# Patient Record
Sex: Female | Born: 1940 | Race: White | Hispanic: No | Marital: Single | Smoking: Former smoker
Health system: Southern US, Community
[De-identification: ages and names within clinical notes are randomized; demographics above are authoritative.]

## PROBLEM LIST (undated history)

## (undated) DIAGNOSIS — T7840XA Allergy, unspecified, initial encounter: Secondary | ICD-10-CM

## (undated) DIAGNOSIS — B019 Varicella without complication: Secondary | ICD-10-CM

## (undated) DIAGNOSIS — H269 Unspecified cataract: Secondary | ICD-10-CM

## (undated) DIAGNOSIS — D649 Anemia, unspecified: Secondary | ICD-10-CM

## (undated) DIAGNOSIS — I10 Essential (primary) hypertension: Secondary | ICD-10-CM

## (undated) DIAGNOSIS — M810 Age-related osteoporosis without current pathological fracture: Secondary | ICD-10-CM

## (undated) DIAGNOSIS — J301 Allergic rhinitis due to pollen: Secondary | ICD-10-CM

## (undated) DIAGNOSIS — K219 Gastro-esophageal reflux disease without esophagitis: Secondary | ICD-10-CM

## (undated) DIAGNOSIS — F32A Depression, unspecified: Secondary | ICD-10-CM

## (undated) DIAGNOSIS — F329 Major depressive disorder, single episode, unspecified: Secondary | ICD-10-CM

## (undated) DIAGNOSIS — M1990 Unspecified osteoarthritis, unspecified site: Secondary | ICD-10-CM

## (undated) HISTORY — DX: Varicella without complication: B01.9

## (undated) HISTORY — PX: WISDOM TOOTH EXTRACTION: SHX21

## (undated) HISTORY — DX: Depression, unspecified: F32.A

## (undated) HISTORY — DX: Allergic rhinitis due to pollen: J30.1

## (undated) HISTORY — DX: Allergy, unspecified, initial encounter: T78.40XA

## (undated) HISTORY — DX: Gastro-esophageal reflux disease without esophagitis: K21.9

## (undated) HISTORY — DX: Unspecified osteoarthritis, unspecified site: M19.90

## (undated) HISTORY — DX: Age-related osteoporosis without current pathological fracture: M81.0

## (undated) HISTORY — DX: Unspecified cataract: H26.9

## (undated) HISTORY — DX: Major depressive disorder, single episode, unspecified: F32.9

## (undated) HISTORY — DX: Essential (primary) hypertension: I10

## (undated) HISTORY — DX: Anemia, unspecified: D64.9

---

## 1999-01-14 ENCOUNTER — Encounter: Admission: RE | Admit: 1999-01-14 | Discharge: 1999-01-14 | Payer: Self-pay | Admitting: Internal Medicine

## 1999-01-14 ENCOUNTER — Encounter: Payer: Self-pay | Admitting: Internal Medicine

## 2000-01-16 ENCOUNTER — Encounter: Admission: RE | Admit: 2000-01-16 | Discharge: 2000-01-16 | Payer: Self-pay | Admitting: Internal Medicine

## 2000-01-16 ENCOUNTER — Encounter: Payer: Self-pay | Admitting: Internal Medicine

## 2003-10-25 ENCOUNTER — Encounter: Admission: RE | Admit: 2003-10-25 | Discharge: 2003-10-25 | Payer: Self-pay | Admitting: Internal Medicine

## 2003-10-31 ENCOUNTER — Encounter: Admission: RE | Admit: 2003-10-31 | Discharge: 2003-10-31 | Payer: Self-pay | Admitting: Internal Medicine

## 2004-11-07 ENCOUNTER — Encounter: Admission: RE | Admit: 2004-11-07 | Discharge: 2004-11-07 | Payer: Self-pay | Admitting: Internal Medicine

## 2005-11-09 ENCOUNTER — Encounter: Admission: RE | Admit: 2005-11-09 | Discharge: 2005-11-09 | Payer: Self-pay | Admitting: Internal Medicine

## 2010-01-10 ENCOUNTER — Ambulatory Visit: Payer: Self-pay | Admitting: Cardiovascular Disease

## 2010-01-13 ENCOUNTER — Telehealth (INDEPENDENT_AMBULATORY_CARE_PROVIDER_SITE_OTHER): Payer: Self-pay | Admitting: *Deleted

## 2010-01-14 ENCOUNTER — Telehealth: Payer: Self-pay | Admitting: Cardiovascular Disease

## 2010-01-21 ENCOUNTER — Ambulatory Visit: Payer: Self-pay | Admitting: Cardiovascular Disease

## 2010-02-27 NOTE — Progress Notes (Signed)
----   Converted from flag ---- ---- 01/10/2010 4:08 PM, Marilynne Halsted, CMA, AAMA wrote: Pt has Medicare and BCBS of .  Precert is not required.  If pt does reschedule and DOS is on or after 02-09-2010 then she will need precert.  ---- 11/22/2534 3:51 PM, Dessie Coma  LPN wrote: Stress Echo scheduled at [facility] for 01/16/10 with Dx: 786.50.  Thanks,Jennifer ------------------------------

## 2010-02-27 NOTE — Progress Notes (Signed)
Summary: lab results  Phone Note Outgoing Call   Call placed by: Dessie Coma  LPN,  January 14, 2010 8:26 AM Call placed to: Patient Summary of Call: Patient notified per Dr. Kirke Corin, labs are fine except for mild anemia.  Will fax results to Dr.Copeland.

## 2010-05-23 ENCOUNTER — Ambulatory Visit (AMBULATORY_SURGERY_CENTER): Payer: Medicare Other | Admitting: *Deleted

## 2010-05-23 VITALS — Ht 59.0 in | Wt 89.1 lb

## 2010-05-23 DIAGNOSIS — D509 Iron deficiency anemia, unspecified: Secondary | ICD-10-CM

## 2010-05-23 MED ORDER — PEG-KCL-NACL-NASULF-NA ASC-C 100 G PO SOLR: ORAL | Status: DC

## 2010-06-06 ENCOUNTER — Ambulatory Visit (AMBULATORY_SURGERY_CENTER): Payer: Medicare Other | Admitting: Internal Medicine

## 2010-06-06 ENCOUNTER — Encounter: Payer: Self-pay | Admitting: Internal Medicine

## 2010-06-06 VITALS — BP 145/65 | HR 70 | Temp 97.6°F | Resp 23 | Ht 59.0 in | Wt 88.0 lb

## 2010-06-06 DIAGNOSIS — D509 Iron deficiency anemia, unspecified: Secondary | ICD-10-CM

## 2010-06-06 DIAGNOSIS — K5520 Angiodysplasia of colon without hemorrhage: Secondary | ICD-10-CM

## 2010-06-06 DIAGNOSIS — Z1211 Encounter for screening for malignant neoplasm of colon: Secondary | ICD-10-CM

## 2010-06-06 MED ORDER — SODIUM CHLORIDE 0.9 % IV SOLN: 500.0000 mL | INTRAVENOUS | Status: DC

## 2010-06-06 NOTE — Patient Instructions (Signed)
Please follow discharge instructions. Normal colonoscopy today. Repeat colonoscopy in 10 years. Resume current medications. Call us with any questions or concerns.

## 2010-06-09 ENCOUNTER — Telehealth: Payer: Self-pay | Admitting: *Deleted

## 2010-06-09 NOTE — Telephone Encounter (Signed)

## 2010-06-10 NOTE — Assessment & Plan Note (Signed)
[facility]                        [facility] OFFICE NOTE   Kristin, Hamilton                        MRN:          [id number]  DATE:01/21/2010                            DOB:          27-Jun-1940    Ms. Kristin Hamilton is a 70 year old female who is here today for a followup  visit.  She has the following problem list:  1. Hypertension.  2. Anxiety.  3. Gastroesophageal reflux disease.  4. Previous tobacco use.   INTERVAL HISTORY:  Ms. Kristin Hamilton was evaluated recently for atypical chest  pain as well as palpitations.  Her symptoms of palpitations have  actually resolved since she was started on metoprolol 25 mg twice daily.  Also her chest pain has resolved after taking Zantac.  She feels now  back to her baseline.   MEDICATIONS:  1. Metoprolol tartrate 25 mg twice daily.  2. Zantac once daily.   PHYSICAL EXAMINATION:  VITAL SIGNS:  Weight is 87.6 pounds, blood  pressure is 145/65, pulse is 59, oxygen saturation is 98% on room air.  NECK:  No JVD or carotid bruits.  LUNGS:  Clear to auscultation.  HEART:  Regular rate and rhythm with no gallops or murmurs.  ABDOMEN:  Benign, nontender, nondistended.  EXTREMITIES:  With no clubbing, cyanosis or edema.   IMPRESSION:  1. Atypical chest pain, was likely gastrointestinal in nature.  This      has resolved completely.  She underwent a stress echocardiogram      which overall showed no evidence of ischemia with normal ejection      fraction.  No further cardiac workup is needed at this time.  2. Palpitations with no evidence of arrhythmia at this time.  Her      symptoms have resolved since she was started on metoprolol 25 mg      twice daily.  Her EKG so far had showed normal sinus rhythm.  I      explained to her that certain types of arrhythmia or paroxysmal and      they do not show all the time.  However, given that she has been      asymptomatic, she was started on metoprolol, there is no point  of      obtaining a Holter monitor at this time.  She will notify us if she      gets further episodes in the future.  During her stress test, she      was able to      exercise for 7 minutes and achieved a maximal heart rate of 137.      There was no evidence      of arrhythmia in her resting as well as exercise EKG.  The patient      will return to see [place] as needed.     [name], MD  Electronically Signed    MA/MedQ  DD: 01/21/2010  DT: 01/22/2010  Job #: [id number]

## 2010-06-10 NOTE — Letter (Signed)
January 10, 2010    [name], MD  [address]  [place] [address]   RE:  Kristin Hamilton, Kristin Hamilton  MRN:  [id number]  /  DOB:  February 14, 1940   Dear Dr. Patsy Hamilton:   Thank you for referring Ms. Kristin Hamilton for further cardiac evaluation.  As  you are aware, this is a pleasant 70 year old female with the following  problem list:  1. Recently diagnosed hypertension.  2. Anxiety.  3. Gastroesophageal reflux disease.  4. Previous tobacco use.   CLINICAL HISTORY:  Ms. Kristin Hamilton is here today for evaluation of chest pain  and palpitations.  She had a prolonged episode of chest pain on Monday  evening which happened at rest.  It felt like tightness and heartburn  substernally with no radiation.  It lasted for about 1 hour and resolved  on its own.  It was associated with intense feeling of palpitations and  fast heartbeats.  The patient does have history of occasional  palpitations and tachycardia but overall these are not frequent.  She  does have mild exertional dyspnea.  There is no previous cardiac  history.  She does not have any previous history of arrhythmia.  She did  feel slightly dizzy at that time but there has been no syncope or  presyncope.  The patient is active and walks daily for about 30 to 40  minutes with no significant limitations.  She was suspected of having  gastroesophageal reflux disease and thus was started on Zantac.  She was  also found to be hypertensive and was started on metoprolol 25 mg twice  daily.  Since that time, her chest pain has improved.  She has not had  any more palpitations.   MEDICATIONS:  1. Metoprolol tartrate 25 mg twice daily.  2. Zantac once daily.   ALLERGIES:  NO KNOWN DRUG ALLERGIES.   SOCIAL HISTORY:  She quit smoking in 2002.  She used to smoke 1 pack per  day for many years.  She denies any alcohol or recreational drug use.  She used to drink 2 cups of coffee daily but cut down since her recent  palpitations.  She is retired and used to  work as a Advice worker.   FAMILY HISTORY:  Negative for premature coronary artery disease.  Her  sister has history of atrial fibrillation.   REVIEW OF SYSTEMS:  Remarkable for allergies, constipation, chest pain,  and palpitations as reported above.  She also has anxiety and some joint  discomfort.  A full review of system was performed and is otherwise  negative.   PHYSICAL EXAMINATION:  GENERAL:  The patient is pleasant who appears to  be slightly anxious but in no acute distress.  VITAL SIGNS:  Weight is 88.6 pounds, blood pressure is 155/65, pulse is  65, oxygen saturation is 100% on room air.  HEENT:  Normocephalic, atraumatic.  NECK:  No JVD or carotid bruits.  RESPIRATORY:  Normal inspiratory effort with no use of accessory  muscles.  Auscultation reveals normal breath sounds.  CARDIOVASCULAR:  Normal PMI.  Normal S1 and S2 with no gallops or  murmurs.  ABDOMEN:  Benign, nontender, nondistended.  EXTREMITIES:  With no clubbing, cyanosis, or edema.  SKIN:  Warm and dry with no rash.  PSYCHIATRIC:  She is alert, oriented x3, with normal mood.  She appears  to be anxious.  MUSCULOSKELETAL:  There is normal muscle strength in the upper and lower  extremities.   An electrocardiogram was performed  which showed sinus rhythm with a  short PR interval but no clear delta waves.  Otherwise, the EKG is  unremarkable.  Her previous EKG was reviewed and it also showed normal  sinus rhythm.   IMPRESSION:  1. Recent chest pain manifested by heartburn and tightness:  Some of      the symptoms might be gastrointestinal in nature.  However, a      cardiac etiology will need to be ruled out as well.  Thus, I      recommend proceeding with a stress echocardiogram for further      evaluation.  I agree with Zantac.  2. Recent palpitations:  This has improved significantly since she was      started on metoprolol.  She actually has not had any symptoms since      then.  There is no  evidence so far of atrial fibrillation.      Although, she could have a paroxysmal arrhythmia.  Initially I      wanted to proceed with the Holter monitor.  However, she mentioned      that she has not been having any symptoms since she was started on      metoprolol.  Thus, the yield for monitoring would be low in this      situation.  Due to that, we will hold off for now and request a      Holter monitor if she gets any recurrent symptoms of palpitations.      In the meantime, I think it is important to rule out underlying      electrolyte or thyroid abnormalities.  Thus, I will send her to      have routine labs done.  I agree also with cutting caffeine intake      which it seems that helped with both her palpitations, as well as      chest pain.  The patient will follow up with me after her cardiac      testing.   Thank you for allowing me to participate in the care of your patient.    Sincerely,      [name], MD  Electronically Signed    MA/MedQ  DD: 01/10/2010  DT: 01/10/2010  Job #: [id number]

## 2010-12-04 ENCOUNTER — Encounter: Payer: Self-pay | Admitting: Cardiovascular Disease

## 2011-02-27 ENCOUNTER — Encounter: Payer: Self-pay | Admitting: Cardiovascular Disease

## 2011-04-22 ENCOUNTER — Other Ambulatory Visit: Payer: Self-pay

## 2011-04-22 MED ORDER — METOPROLOL TARTRATE 25 MG PO TABS: 25.0000 mg | ORAL_TABLET | Freq: Two times a day (BID) | ORAL | Status: DC

## 2011-07-28 ENCOUNTER — Other Ambulatory Visit: Payer: Self-pay | Admitting: Physician Assistant

## 2011-08-06 ENCOUNTER — Encounter: Payer: Self-pay | Admitting: Family Medicine

## 2011-08-06 ENCOUNTER — Ambulatory Visit (INDEPENDENT_AMBULATORY_CARE_PROVIDER_SITE_OTHER): Payer: Medicare Other | Admitting: Family Medicine

## 2011-08-06 VITALS — BP 146/68 | HR 64 | Temp 97.0°F | Resp 16 | Ht 59.5 in | Wt 90.6 lb

## 2011-08-06 DIAGNOSIS — K219 Gastro-esophageal reflux disease without esophagitis: Secondary | ICD-10-CM | POA: Insufficient documentation

## 2011-08-06 DIAGNOSIS — I10 Essential (primary) hypertension: Secondary | ICD-10-CM | POA: Insufficient documentation

## 2011-08-06 DIAGNOSIS — F419 Anxiety disorder, unspecified: Secondary | ICD-10-CM | POA: Insufficient documentation

## 2011-08-06 DIAGNOSIS — Z87891 Personal history of nicotine dependence: Secondary | ICD-10-CM | POA: Insufficient documentation

## 2011-08-06 MED ORDER — METOPROLOL TARTRATE 25 MG PO TABS: 25.0000 mg | ORAL_TABLET | Freq: Two times a day (BID) | ORAL | Status: DC

## 2011-08-06 NOTE — Progress Notes (Signed)
  Subjective:    Patient ID: Kristin Hamilton, female    DOB: December 27, 1940, 71 y.o.   MRN: [id number]  HPI  This 71 y.o. Cauc female has HTN and was last seen about 1 year ago; she is a retired Comptroller.  She has no problems with CP, HA, palpitations, cough, SOB, edema or weakness; she has occasional  dizziness when she bends over then straightens to fast. Denies medication side effects.   Review of Systems As per HPI    Objective:   Physical Exam  Nursing note and vitals reviewed. Constitutional: She is oriented to person, place, and time. She appears well-developed and well-nourished. No distress.  HENT:  Head: Normocephalic and atraumatic.  Eyes: Conjunctivae and EOM are normal. No scleral icterus.  Neck: No JVD present. No thyromegaly present.  Cardiovascular: Normal rate, regular rhythm and normal heart sounds.  Exam reveals no gallop.   No murmur heard. Pulmonary/Chest: Effort normal and breath sounds normal. No respiratory distress.  Musculoskeletal: She exhibits no edema.  Lymphadenopathy:    She has no cervical adenopathy.  Neurological: She is alert and oriented to person, place, and time. No cranial nerve deficit.  Skin: Skin is warm and dry.          Assessment & Plan:       . HTN (hypertension)- stable on current medication; RF Metoprolol 90-day supply with 1 RF; RTC in 4 months for CPE/ labs

## 2011-08-06 NOTE — Patient Instructions (Addendum)

## 2011-08-11 ENCOUNTER — Encounter: Payer: Self-pay | Admitting: Family Medicine

## 2011-12-02 ENCOUNTER — Ambulatory Visit (INDEPENDENT_AMBULATORY_CARE_PROVIDER_SITE_OTHER): Payer: Medicare Other | Admitting: Family Medicine

## 2011-12-02 ENCOUNTER — Encounter: Payer: Self-pay | Admitting: Family Medicine

## 2011-12-02 VITALS — BP 144/69 | HR 64 | Temp 97.6°F | Resp 16 | Ht 58.5 in | Wt 90.0 lb

## 2011-12-02 DIAGNOSIS — Z23 Encounter for immunization: Secondary | ICD-10-CM

## 2011-12-02 DIAGNOSIS — I10 Essential (primary) hypertension: Secondary | ICD-10-CM

## 2011-12-02 DIAGNOSIS — Z1231 Encounter for screening mammogram for malignant neoplasm of breast: Secondary | ICD-10-CM

## 2011-12-02 DIAGNOSIS — Z13220 Encounter for screening for lipoid disorders: Secondary | ICD-10-CM

## 2011-12-02 DIAGNOSIS — Z0000 Encounter for general adult medical examination without abnormal findings: Secondary | ICD-10-CM

## 2011-12-02 LAB — POCT URINALYSIS DIPSTICK
Glucose, UA: NEGATIVE
Leukocytes, UA: NEGATIVE
Nitrite, UA: NEGATIVE
Protein, UA: NEGATIVE
Urobilinogen, UA: 0.2
pH, UA: 6.5

## 2011-12-02 LAB — COMPREHENSIVE METABOLIC PANEL
AST: 17 U/L (ref 0–37)
CO2: 25 mEq/L (ref 19–32)
Calcium: 9 mg/dL (ref 8.4–10.5)
Chloride: 104 mEq/L (ref 96–112)
Potassium: 3.7 mEq/L (ref 3.5–5.3)
Sodium: 139 mEq/L (ref 135–145)

## 2011-12-02 LAB — LIPID PANEL
HDL: 60 mg/dL (ref >39)
LDL Cholesterol: 127 mg/dL — ABNORMAL HIGH (ref 0–99)
Triglycerides: 81 mg/dL (ref <150)
VLDL: 16 mg/dL (ref 0–40)

## 2011-12-02 MED ORDER — METOPROLOL TARTRATE 25 MG PO TABS: 25.0000 mg | ORAL_TABLET | Freq: Two times a day (BID) | ORAL | Status: DC

## 2011-12-02 NOTE — Patient Instructions (Addendum)
Keeping You Healthy  Get These Tests  Blood Pressure- Have your blood pressure checked by your healthcare provider at least once a year.  Normal blood pressure is 120/80.  Weight- Have your body mass index (BMI) calculated to screen for obesity.  BMI is a measure of body fat based on height and weight.  You can calculate your own BMI at https://www.west-esparza.com/  Cholesterol- Have your cholesterol checked every year.  Diabetes- Have your blood sugar checked every year if you have high blood pressure, high cholesterol, a family history of diabetes or if you are overweight.  Pap Smear- Have a pap smear every 1 to 3 years if you have been sexually active.  If you are older than 65 and recent pap smears have been normal you may not need additional pap smears.  In addition, if you have had a hysterectomy  For benign disease additional pap smears are not necessary.  Mammogram-Yearly mammograms are essential for early detection of breast cancer  Screening for Colon Cancer- Colonoscopy starting at age 47. Screening may begin sooner depending on your family history and other health conditions.  Follow up colonoscopy as directed by your Gastroenterologist.  Screening for Osteoporosis- Screening begins at age 11 with bone density scanning, sooner if you are at higher risk for developing Osteoporosis.  Get these medicines  Calcium with Vitamin D- Your body requires 1200-1500 mg of Calcium a day and [id number] IU of Vitamin D a day.  You can only absorb 500 mg of Calcium at a time therefore Calcium must be taken in 2 or 3 separate doses throughout the day.  Hormones- Hormone therapy has been associated with increased risk for certain cancers and heart disease.  Talk to your healthcare provider about if you need relief from menopausal symptoms.  Aspirin- Ask your healthcare provider about taking Aspirin to prevent Heart Disease and Stroke.  Get these Immuniztions  Flu shot- Every fall  Pneumonia  shot- Once after the age of 28; if you are younger ask your healthcare provider if you need a pneumonia shot. You received this vaccine today.  Tetanus- Every ten years.  Zostavax- Once after the age of 64 to prevent shingles.  You are given a prescription for this vaccine to take to [facility] or [facility] at [facility]. Get this done after the Holidays.  Take these steps  Don't smoke- Your healthcare provider can help you quit. For tips on how to quit, ask your healthcare provider or go to www.smokefree.gov or call 1-800 QUIT-NOW.  Be physically active- Exercise 5 days a week for a minimum of 30 minutes.  If you are not already physically active, start slow and gradually work up to 30 minutes of moderate physical activity.  Try walking, dancing, bike riding, swimming, etc.  Eat a healthy diet- Eat a variety of healthy foods such as fruits, vegetables, whole grains, low fat milk, low fat cheeses, yogurt, lean meats, chicken, fish, eggs, dried beans, tofu, etc.  For more information go to www.thenutritionsource.org  Dental visit- Brush and floss teeth twice daily; visit your dentist twice a year.  Eye exam- Visit your Optometrist or Ophthalmologist yearly.  Drink alcohol in moderation- Limit alcohol intake to one drink or less a day.  Never drink and drive.  Depression- Your emotional health is as important as your physical health.  If you're feeling down or losing interest in things you normally enjoy, please talk to your healthcare provider.  Seat Belts- can save your life;  always wear one  Smoke/Carbon Monoxide detectors- These detectors need to be installed on the appropriate level of your home.  Replace batteries at least once a year.  Violence- If anyone is threatening or hurting you, please tell your healthcare provider.  Living Will/ Health care power of attorney- Discuss with your healthcare provider and family.   Hay Fever  Hay fever is a type of  allergy that people have to things like grass, animals, or pollen from plants and flowers. It cannot be passed from one person to another. You cannot cure hay fever, but there are things that may help relieve your problems (symptoms). HOME CARE  Avoid the things that may be causing your problems.  Take all medicine as told by your doctor. GET HELP RIGHT AWAY IF:  You have asthma, a cough, and you start making whistling sounds when breathing (wheezing).  Your tongue or lips are puffy (swollen).  You have trouble breathing.  You feel lightheaded or like you will pass out (faint).  You have a fever.  Your problems are getting worse and your medicine is not helping.  Your treatment was working, but your problems have come back.  You are stuffed up (congested) and have pressure in your face.  You have a headache.  You have cold sweats. MAKE SURE YOU:  Understand these instructions.  Will watch your condition.  Will get help right away if you are not doing well or get worse. Document Released: 05/14/2010 Document Revised: 04/06/2011 Document Reviewed: 05/14/2010 [facility] Patient Information 2013 [place], [place].  You can try Cetirizine OTC  5 mg tablets  1 tablet daily. Ask the pharmacy tech to help you locate the appropriate dose.    Depression You have signs of depression. This is a common problem. It can occur at any age. It is often hard to recognize. People can suffer from depression and still have moments of enjoyment. Depression interferes with your basic ability to function in life. It upsets your relationships, sleep, eating, and work habits. CAUSES  Depression is believed to be caused by an imbalance in brain chemicals. It may be triggered by an unpleasant event. Relationship crises, a death in the family, financial worries, retirement, or other stressors are normal causes of depression. Depression may also start for no known reason. Other factors that may play a part  include medical illnesses, some medicines, genetics, and alcohol or drug abuse. SYMPTOMS   Feeling unhappy or worthless.   Long-lasting (chronic) tiredness or worn-out feeling.   Self-destructive thoughts and actions.   Not being able to sleep or sleeping too much.   Eating more than usual or not eating at all.   Headaches or feeling anxious.   Trouble concentrating or making decisions.   Unexplained physical problems and substance abuse.  TREATMENT  Depression usually gets better with treatment. This can include:  Antidepressant medicines. It can take weeks before the proper dose is achieved and benefits are reached.   Talking with a therapist, clergyperson, counselor, or friend. These people can help you gain insight into your problem and regain control of your life.   Eating a good diet.   Getting regular physical exercise, such as walking for 30 minutes every day.   Not abusing alcohol or drugs.  Treating depression often takes 6 months or longer. This length of treatment is needed to keep symptoms from returning. Call your caregiver and arrange for follow-up care as suggested. SEEK IMMEDIATE MEDICAL CARE IF:   You start to  have thoughts of hurting yourself or others.   Call your local emergency services (911 in [place]   Go to your local medical emergency department.   Call the National Suicide Prevention Lifeline: 1-800-273-TALK (563)230-4111).  Document Released: 01/12/2005 Document Revised: 01/01/2011 Document Reviewed: 06/14/2009 [facility] Patient Information 2012 [place], [place].  As we discussed, don't be so hard or critical of yourself; continue to find and enjoy opportunities to discover new things to do, places to see and people to meet !

## 2011-12-02 NOTE — Progress Notes (Signed)
Subjective:    Patient ID: Kristin Hamilton, female    DOB: Jul 01, 1940, 71 y.o.   MRN: [id number]  HPI  This delightful 71 y.o  Cauc female is here for [facility] Annual Wellness exam. HTN is controlled  on Metoprolol 25 mg bid w/o side effects. Pt is retired and is active with some volunteer activities  and walks daily in addition to practicing yoga.     Last PAP: years ago (normal)  Last MMG: normal  Last Colonoscopy: May 2012 (normal)  Last ECG: 2011   Review of Systems  HENT: Positive for nosebleeds, congestion, postnasal drip and sinus pressure.   Eyes: Positive for itching.  Gastrointestinal: Positive for constipation.  Genitourinary: Positive for frequency.  Musculoskeletal: Positive for back pain.  Psychiatric/Behavioral: Positive for sleep disturbance. The patient is nervous/anxious.        Objective:   Physical Exam  Nursing note and vitals reviewed. Constitutional: She is oriented to person, place, and time. Vital signs are normal. She appears well-developed and well-nourished. No distress.  HENT:  Head: Normocephalic and atraumatic.  Right Ear: Hearing, tympanic membrane, external ear and ear canal normal.  Left Ear: Hearing, tympanic membrane, external ear and ear canal normal.  Nose: No nasal deformity or septal deviation.  Mouth/Throat: Uvula is midline, oropharynx is clear and moist and mucous membranes are normal. No oral lesions. Normal dentition. No dental caries.  Eyes: Conjunctivae normal, EOM and lids are normal. Pupils are equal, round, and reactive to light.       Wears corrective lenses; eye care per specialist  Neck: Normal range of motion. Neck supple. No JVD present. No thyromegaly present.  Cardiovascular: Normal rate, regular rhythm, normal heart sounds and intact distal pulses.  Exam reveals no gallop and no friction rub.   No murmur heard. Pulmonary/Chest: Effort normal and breath sounds normal. No respiratory distress. She has no wheezes. She has no  rales. Right breast exhibits no inverted nipple, no mass, no nipple discharge, no skin change and no tenderness. Left breast exhibits no inverted nipple, no mass, no nipple discharge, no skin change and no tenderness. Breasts are symmetrical.  Abdominal: Soft. Normal appearance and bowel sounds are normal. She exhibits no distension, no abdominal bruit, no pulsatile midline mass and no mass. There is no hepatosplenomegaly. There is no tenderness. There is no guarding and no CVA tenderness. No hernia.  Genitourinary:       Deferred  Musculoskeletal: Normal range of motion. She exhibits no edema and no tenderness.  Lymphadenopathy:    She has no cervical adenopathy.  Neurological: She is alert and oriented to person, place, and time. She has normal reflexes. No cranial nerve deficit. She exhibits normal muscle tone. Coordination normal.  Skin: Skin is warm and dry. No erythema. No pallor.       Back with multiple Seb K's- 1 large one with dark gray scaliness on right lower back  Psychiatric: She has a normal mood and affect. Her behavior is normal. Judgment and thought content normal.    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   Pt reports feeling sad, sees a lot of failures in past, less enjoyment in life, has disappointment in self and is critical of self because of weaknesses/ mistakes, has decreased motivation and decreased libido.      Assessment & Plan:   1. Routine general medical examination at a health care facility  POCT urinalysis dipstick  2. HTN (hypertension)  Comprehensive metabolic panel, Lipid panel, TSH RF: Metoprolol  X  12 months  3. Screening for hyperlipidemia    4. Other screening mammogram  MM Digital Screening  5. Need for prophylactic vaccination against Streptococcus pneumoniae (pneumococcus)  Pneumococcal polysaccharide vaccine 23-valent greater than or equal to 2yo  subcutaneous/IM

## 2011-12-03 DIAGNOSIS — L821 Other seborrheic keratosis: Secondary | ICD-10-CM | POA: Insufficient documentation

## 2011-12-03 NOTE — Progress Notes (Signed)
Quick Note:  Please call pt and advise that the following labs are abnormal...  Metabolic profile ("chemistries") are normal. LDL("bad") cholesterol is above normal; based on all of the cholesterol values, your risk of heart disease is 1/2 the average risk.  Continue to eat healthy and stay active to maintain your current weight.  Take care!  Copy to pt.  ______

## 2012-01-12 ENCOUNTER — Ambulatory Visit
Admission: RE | Admit: 2012-01-12 | Discharge: 2012-01-12 | Disposition: A | Discharge status: Home / Self Care | Payer: Medicare Other | Source: Ambulatory Visit | Attending: Family Medicine | Admitting: Family Medicine

## 2012-01-12 DIAGNOSIS — Z1231 Encounter for screening mammogram for malignant neoplasm of breast: Secondary | ICD-10-CM

## 2012-06-29 ENCOUNTER — Encounter: Payer: Self-pay | Admitting: Family Medicine

## 2012-06-29 ENCOUNTER — Ambulatory Visit (INDEPENDENT_AMBULATORY_CARE_PROVIDER_SITE_OTHER): Payer: Medicare Other | Admitting: Family Medicine

## 2012-06-29 VITALS — BP 124/68 | HR 61 | Temp 98.1°F | Resp 16 | Ht 58.5 in | Wt 89.4 lb

## 2012-06-29 DIAGNOSIS — I10 Essential (primary) hypertension: Secondary | ICD-10-CM

## 2012-06-29 NOTE — Progress Notes (Signed)
S:  This 72 y.o. Cauc female has HTN and is here for BP recheck. She is compliant w/ medication and checks her BP at home; readings are in the normal range. She denies diaphoresis, fatigue, CP or tightness, palpitations, edema, SOB or DOE, cough, abdominal pain, HA, dizziness or lightheadedness, numbness or syncope.  Patient Active Problem List   Diagnosis Date Noted  . Seborrheic keratosis 12/03/2011  . HTN (hypertension) 08/06/2011  . Anxiety 08/06/2011  . GERD (gastroesophageal reflux disease) 08/06/2011  . History of tobacco use 08/06/2011    PMHx, Soc Hx and Fam Hx reviewed.  O: Filed Vitals:   06/29/12 1121  BP: 124/68  Pulse: 61  Temp: 98.1 F (36.7 C)  Resp: 16   GEN: In NAD; WN,WD. Weight is stable. HENT: [place]/AT; EOMI w/ clear conj/ sclerae. Otherwise normal. COR: RRR. LUNGS: Normal resp rate and effort. SKIN: W&D; no rashes, erythema or pallor. NEURO: A&O x 3; CNs intact. Nonfocal.  A/P: HTN, goal below 150/90- pt to continue monitoring BP at home; she will attempt to get a wrist cuff for easier use. Continue current medication (Metoprolol 25 mg 1 tab bid) and return to clinic in 5 months for HTN follow-up and Flu vaccine.

## 2012-11-24 ENCOUNTER — Other Ambulatory Visit: Payer: Self-pay | Admitting: Family Medicine

## 2012-12-01 ENCOUNTER — Ambulatory Visit: Payer: Medicare Other | Admitting: Family Medicine

## 2012-12-13 ENCOUNTER — Ambulatory Visit (INDEPENDENT_AMBULATORY_CARE_PROVIDER_SITE_OTHER): Payer: Medicare Other | Admitting: Family Medicine

## 2012-12-13 ENCOUNTER — Encounter: Payer: Self-pay | Admitting: Family Medicine

## 2012-12-13 VITALS — BP 160/65 | HR 62 | Temp 97.7°F | Resp 16 | Ht <= 58 in | Wt 88.0 lb

## 2012-12-13 DIAGNOSIS — I10 Essential (primary) hypertension: Secondary | ICD-10-CM

## 2012-12-13 DIAGNOSIS — Z23 Encounter for immunization: Secondary | ICD-10-CM

## 2012-12-13 MED ORDER — METOPROLOL TARTRATE 25 MG PO TABS: ORAL_TABLET | ORAL | Status: DC

## 2012-12-13 NOTE — Progress Notes (Signed)
S: This 72 y.o. Cauc female returns for HTN follow-up; she recently purchased a home monitor and Has checked BP once (130-140/80-90). Pt has occasional dizziness if she stands up too fast but, otherwise, is asymptomatic. She is complaint w/ medication w/o side effects. She denies fatigue, diaphoresis, vision disturbances, CP or tightness, palpitations, edema, SOB or DOE, cough, orthopnea, HA, numbness, weakness or syncope.   Patient Active Problem List   Diagnosis Date Noted  . Seborrheic keratosis 12/03/2011  . HTN (hypertension) 08/06/2011  . Anxiety 08/06/2011  . GERD (gastroesophageal reflux disease) 08/06/2011  . History of tobacco use 08/06/2011   PMHx, Soc Hx and Fam Hx reviewed. MEdications reconciled.  ROS: As per HPI.  O: Filed Vitals:   12/13/12 1259  BP: 160/65  Pulse: 62  Temp: 97.7 F (36.5 C)  Resp: 16   GEN: In NAD; WN,WD. Weight down 2 lbs. in 12 months HENT: [place]/AT; EOMI w/ clear conj/sclerae. Otherwise unremarkable. COR; RRR. LUNGS: Unlabored resp. SKIN: W&D; intact w/o rashes, pallor or erythema. NEURO: A&O x 3; CNs intact. Nonfocal.  A/P: HTN (hypertension)- Stable; continue current medication. Check BP 3-4 times/week and if symptomatic.    Need for prophylactic vaccination and inoculation against influenza - Plan: Flu Vaccine QUAD 36+ mos IM

## 2013-02-07 ENCOUNTER — Ambulatory Visit (INDEPENDENT_AMBULATORY_CARE_PROVIDER_SITE_OTHER): Payer: Medicare Other | Admitting: Family Medicine

## 2013-02-07 VITALS — BP 120/76 | HR 73 | Temp 98.4°F | Resp 17 | Ht 58.5 in | Wt 89.0 lb

## 2013-02-07 DIAGNOSIS — R300 Dysuria: Secondary | ICD-10-CM

## 2013-02-07 LAB — POCT UA - MICROSCOPIC ONLY
BACTERIA, U MICROSCOPIC: NEGATIVE
Casts, Ur, LPF, POC: NEGATIVE
Crystals, Ur, HPF, POC: NEGATIVE
EPITHELIAL CELLS, URINE PER MICROSCOPY: NEGATIVE
Mucus, UA: NEGATIVE
WBC, Ur, HPF, POC: NEGATIVE
Yeast, UA: NEGATIVE

## 2013-02-07 LAB — POCT URINALYSIS DIPSTICK
Bilirubin, UA: NEGATIVE
Glucose, UA: NEGATIVE
KETONES UA: NEGATIVE
LEUKOCYTES UA: NEGATIVE
Nitrite, UA: NEGATIVE
PH UA: 7
PROTEIN UA: NEGATIVE
SPEC GRAV UA: 1.01
Urobilinogen, UA: 0.2

## 2013-02-07 MED ORDER — CIPROFLOXACIN HCL 250 MG PO TABS: 250.0000 mg | ORAL_TABLET | Freq: Two times a day (BID) | ORAL | Status: DC

## 2013-02-07 NOTE — Progress Notes (Signed)
Patient ID: Kristin Hamilton MRN: [id number], DOB: 1940/07/13, 73 y.o. Date of Encounter: 02/07/2013, 3:22 PM  Primary Physician: Abbe AmsterdamOPLAND,JESSICA, MD  Chief Complaint:  Chief Complaint  Patient presents with  . Dysuria    HPI: 73 y.o. year old female presents with 1 day history of dysuria, urgency, and frequency. Last UTI was several years ago (?2001) No hematuria  No sick contacts, recent antibiotics, or recent travels.   No vaginal discharge, back pain, fever  Past Medical History  Diagnosis Date  . Hypertension   . Anemia   . Constipation   . Cataract      Home Meds: Prior to Admission medications   Medication Sig Start Date End Date Taking? Authorizing Provider  metoprolol tartrate (LOPRESSOR) 25 MG tablet TAKE 1 TABLET TWICE A DAY 12/13/12  Yes Maurice MarchBarbara B McPherson, MD  OVER THE COUNTER MEDICATION OTC Allway eye drops for allergies prn   Yes Historical Provider, MD  Polyethyl Glycol-Propyl Glycol (SYSTANE OP) Apply 1 drop to eye 2 (two) times daily as needed.     Yes Historical Provider, MD  Ranitidine HCl (ZANTAC 75 PO) Take 1 tablet by mouth as needed.     Yes Historical Provider, MD    Allergies:  Allergies  Allergen Reactions  . Codeine Nausea Only    History   Social History  . Marital Status: Single    Spouse Name: N/A    Number of Children: N/A  . Years of Education: N/A   Occupational History  . retired [employer]   Social History Main Topics  . Smoking status: Former Games developermoker  . Smokeless tobacco: Not on file  . Alcohol Use: No  . Drug Use: No  . Sexual Activity:    Other Topics Concern  . Not on file   Social History Narrative  . No narrative on file     Review of Systems: Constitutional: negative for chills, fever, night sweats or weight changes Cardiovascular: negative for chest pain or palpitations Respiratory: negative for hemoptysis, wheezing, or shortness of breath Abdominal: negative for abdominal pain, nausea,  vomiting or diarrhea Dermatological: negative for rash Neurologic: negative for headache   Physical Exam: Blood pressure 120/76, pulse 73, temperature 98.4 F (36.9 C), temperature source Oral, resp. rate 17, height 4' 10.5" (1.486 m), weight 89 lb (40.37 kg), SpO2 100.00%., Body mass index is 18.28 kg/(m^2). General: Well developed, well nourished, in no acute distress. Head: Normocephalic, atraumatic, eyes without discharge, sclera non-icteric, nares are congested. Bilateral auditory canals clear, TM's are without perforation, pearly grey with reflective cone of light bilaterally. Serous effusion bilaterally behind TM's. Maxillary sinus TTP. Oral cavity moist, dentition normal. Posterior pharynx with post nasal drip and mild erythema. No peritonsillar abscess or tonsillar exudate. Neck: Supple. No thyromegaly. Full ROM. No lymphadenopathy. Lungs: Coarse breath sounds bilaterally without Clear bilaterally to auscultation without wheezes, rales, or rhonchi. Breathing is unlabored.  Heart: RRR with S1 S2. No murmurs, rubs, or gallops appreciated. Abdomen: Soft, non-tender, non-distended with normoactive bowel sounds. No hepatosplenomegaly. No rebound/guarding. No obvious abdominal masses. McBurney's, Rovsing's, Iliopsoas, and table jar all negative. Msk:  Strength and tone normal for age. Extremities: No clubbing or cyanosis. No edema. Neuro: Alert and oriented X 3. Moves all extremities spontaneously. CNII-XII grossly in tact. Psych:  Responds to questions appropriately with a normal affect.    Labs: Results for orders placed in visit on 02/07/13  POCT URINALYSIS DIPSTICK      Result Value Range  Color, UA yellow     Clarity, UA clear     Glucose, UA neg     Bilirubin, UA neg     Ketones, UA neg     Spec Grav, UA 1.010     Blood, UA tr-lysed     pH, UA 7.0     Protein, UA neg     Urobilinogen, UA 0.2     Nitrite, UA neg     Leukocytes, UA Negative    POCT UA - MICROSCOPIC ONLY       Result Value Range   WBC, Ur, HPF, POC neg     RBC, urine, microscopic 0-2     Bacteria, U Microscopic neg     Mucus, UA neg     Epithelial cells, urine per micros neg     Crystals, Ur, HPF, POC neg     Casts, Ur, LPF, POC neg     Yeast, UA neg        ASSESSMENT AND PLAN:  73 y.o. year old female with Dysuria - Plan: POCT urinalysis dipstick, POCT UA - Microscopic Only, Urine culture, ciprofloxacin (CIPRO) 250 MG tablet   - -Mucinex -Tylenol/Motrin prn -Rest/fluids -RTC precautions -RTC 3-5 days if no improvement  Signed, Elvina Sidle, MD 02/07/2013 3:22 PM

## 2013-02-08 LAB — URINE CULTURE
Colony Count: NO GROWTH
Organism ID, Bacteria: NO GROWTH

## 2013-02-09 ENCOUNTER — Other Ambulatory Visit: Payer: Self-pay

## 2013-02-09 ENCOUNTER — Telehealth: Payer: Self-pay

## 2013-02-09 DIAGNOSIS — M810 Age-related osteoporosis without current pathological fracture: Secondary | ICD-10-CM

## 2013-02-09 DIAGNOSIS — Z1231 Encounter for screening mammogram for malignant neoplasm of breast: Secondary | ICD-10-CM

## 2013-02-09 DIAGNOSIS — Z780 Asymptomatic menopausal state: Secondary | ICD-10-CM

## 2013-02-09 NOTE — Telephone Encounter (Signed)
Patient said dr Audria Ninemcpherson wanted her to have a bone density screening, she would like to know if we can send referral for this please call [phone] with questions

## 2013-02-10 NOTE — Telephone Encounter (Signed)
I placed an order for this test; unfortunately the only diagnosis that would associate successfully was "Osteoporosis". Let the pt know test has been ordered w/ that diagnosis. Can you find out if she has been told that she has this or if she has ever had a bone density study. Thanks.

## 2013-02-10 NOTE — Telephone Encounter (Signed)
Dr Audria NineMcPherson can we refer for pt?

## 2013-02-13 NOTE — Telephone Encounter (Signed)
LMOM to Cb.

## 2013-02-16 NOTE — Telephone Encounter (Signed)
Spoke with pt, she states she has never been told she has osteoporosis before. She has never had a bone density scan.

## 2013-02-23 ENCOUNTER — Other Ambulatory Visit: Payer: Self-pay | Admitting: Family Medicine

## 2013-03-02 ENCOUNTER — Ambulatory Visit
Admission: RE | Admit: 2013-03-02 | Discharge: 2013-03-02 | Disposition: A | Discharge status: Home / Self Care | Payer: Medicare Other | Source: Ambulatory Visit

## 2013-03-02 DIAGNOSIS — Z1231 Encounter for screening mammogram for malignant neoplasm of breast: Secondary | ICD-10-CM

## 2013-04-18 ENCOUNTER — Telehealth: Payer: Self-pay | Admitting: Family Medicine

## 2013-04-18 ENCOUNTER — Encounter: Payer: Self-pay | Admitting: Family Medicine

## 2013-04-18 ENCOUNTER — Ambulatory Visit (INDEPENDENT_AMBULATORY_CARE_PROVIDER_SITE_OTHER): Payer: Medicare Other | Admitting: Family Medicine

## 2013-04-18 VITALS — BP 160/72 | HR 65 | Temp 97.9°F | Resp 16 | Ht <= 58 in | Wt 87.8 lb

## 2013-04-18 DIAGNOSIS — F32A Depression, unspecified: Secondary | ICD-10-CM

## 2013-04-18 DIAGNOSIS — E7800 Pure hypercholesterolemia, unspecified: Secondary | ICD-10-CM

## 2013-04-18 DIAGNOSIS — F320 Major depressive disorder, single episode, mild: Secondary | ICD-10-CM

## 2013-04-18 DIAGNOSIS — K921 Melena: Secondary | ICD-10-CM

## 2013-04-18 DIAGNOSIS — Z139 Encounter for screening, unspecified: Secondary | ICD-10-CM

## 2013-04-18 DIAGNOSIS — Z23 Encounter for immunization: Secondary | ICD-10-CM

## 2013-04-18 DIAGNOSIS — Z0000 Encounter for general adult medical examination without abnormal findings: Secondary | ICD-10-CM

## 2013-04-18 DIAGNOSIS — Z8781 Personal history of (healed) traumatic fracture: Secondary | ICD-10-CM | POA: Insufficient documentation

## 2013-04-18 DIAGNOSIS — I10 Essential (primary) hypertension: Secondary | ICD-10-CM

## 2013-04-18 LAB — CBC WITH DIFFERENTIAL/PLATELET
Basophils Absolute: 0 10*3/uL (ref 0.0–0.1)
Basophils Relative: 1 % (ref 0–1)
EOS ABS: 0 10*3/uL (ref 0.0–0.7)
EOS PCT: 1 % (ref 0–5)
HEMATOCRIT: 34.4 % — AB (ref 36.0–46.0)
Hemoglobin: 11.6 g/dL — ABNORMAL LOW (ref 12.0–15.0)
LYMPHS ABS: 0.7 10*3/uL (ref 0.7–4.0)
Lymphocytes Relative: 20 % (ref 12–46)
MCH: 29.3 pg (ref 26.0–34.0)
MCHC: 33.7 g/dL (ref 30.0–36.0)
MCV: 86.9 fL (ref 78.0–100.0)
MONO ABS: 0.3 10*3/uL (ref 0.1–1.0)
Monocytes Relative: 7 % (ref 3–12)
Neutro Abs: 2.6 10*3/uL (ref 1.7–7.7)
Neutrophils Relative %: 71 % (ref 43–77)
PLATELETS: 291 10*3/uL (ref 150–400)
RBC: 3.96 MIL/uL (ref 3.87–5.11)
RDW: 13.7 % (ref 11.5–15.5)
WBC: 3.6 10*3/uL — ABNORMAL LOW (ref 4.0–10.5)

## 2013-04-18 LAB — IFOBT (OCCULT BLOOD): IFOBT: POSITIVE

## 2013-04-18 NOTE — Progress Notes (Signed)
Subjective:    Patient ID: Kristin Hamilton, female    DOB: 1940-11-19, 73 y.o.   MRN: [id number]  HPI  This 73 y.o. Cauc female is here for [facility] subsequent annual CPE. She has well controlled HTN and is complaint w/ medications w/o adverse effects.   HCM; CRS- 2012 (polyps?)           MMG- Current (negative).           Vision- Needs to schedule.  Patient Active Problem List   Diagnosis Date Noted  . History of wrist fracture 04/18/2013  . Seborrheic keratosis 12/03/2011  . HTN (hypertension) 08/06/2011  . Anxiety 08/06/2011  . GERD (gastroesophageal reflux disease) 08/06/2011  . History of tobacco use 08/06/2011   PMHx, Surg Hx, Soc and Fam Hx reviewed.   Review of Systems  Constitutional: Negative.   HENT: Negative.   Eyes: Negative.   Respiratory: Negative.   Cardiovascular: Negative.   Gastrointestinal: Negative.   Endocrine: Negative.   Genitourinary: Negative.   Musculoskeletal: Negative.   Skin: Negative.   Allergic/Immunologic: Negative.   Neurological: Negative.   Hematological: Negative.   Psychiatric/Behavioral: Negative.        See Depression screening score = 13.       Objective:   Physical Exam  Constitutional: She is oriented to person, place, and time. Vital signs are normal. She appears well-developed and well-nourished. No distress.  HENT:  Head: Normocephalic and atraumatic.  Right Ear: Hearing, tympanic membrane, external ear and ear canal normal.  Left Ear: Hearing, tympanic membrane, external ear and ear canal normal.  Nose: Nose normal. No nasal deformity or septal deviation.  Mouth/Throat: Uvula is midline, oropharynx is clear and moist and mucous membranes are normal. No oral lesions. Normal dentition.  Eyes: Conjunctivae, EOM and lids are normal. Pupils are equal, round, and reactive to light. No scleral icterus.  Fundoscopic exam:      The right eye shows no arteriolar narrowing and no papilledema. The right eye shows red reflex.   The left eye shows no arteriolar narrowing and no papilledema. The left eye shows red reflex.  Neck: Trachea normal, normal range of motion and full passive range of motion without pain. Neck supple. No JVD present. No spinous process tenderness and no muscular tenderness present. Carotid bruit is not present. No mass and no thyromegaly present.  Cardiovascular: Normal rate, regular rhythm, S1 normal, S2 normal, normal heart sounds and normal pulses.   No extrasystoles are present. PMI is not displaced.  Exam reveals no gallop and no friction rub.   No murmur heard. Pulmonary/Chest: Effort normal and breath sounds normal. No respiratory distress.  Abdominal: Soft. Normal appearance and bowel sounds are normal. She exhibits no distension, no abdominal bruit, no pulsatile midline mass and no mass. There is no hepatosplenomegaly. There is no tenderness. There is no guarding and no CVA tenderness.  Genitourinary: Rectum normal. Rectal exam shows no external hemorrhoid, no fissure, no mass, no tenderness and anal tone normal.  NEFG; pelvic deferred.  Musculoskeletal: Normal range of motion. She exhibits no edema.       Cervical back: Normal.       Thoracic back: Normal.       Lumbar back: Normal.       Right hand: She exhibits bony tenderness and deformity. She exhibits no swelling. Normal sensation noted. Normal strength noted.       Left hand: She exhibits bony tenderness and deformity. She exhibits no swelling. Normal  sensation noted. Normal strength noted.  Lymphadenopathy:       Head (right side): No submental, no submandibular, no tonsillar, no posterior auricular and no occipital adenopathy present.       Head (left side): No submental, no submandibular, no tonsillar, no posterior auricular and no occipital adenopathy present.    She has no cervical adenopathy.       Right: No inguinal and no supraclavicular adenopathy present.       Left: No inguinal and no supraclavicular adenopathy present.    Neurological: She is alert and oriented to person, place, and time. She has normal strength. She displays no atrophy and no tremor. No cranial nerve deficit or sensory deficit. She exhibits normal muscle tone. Coordination and gait normal.  Reflex Scores:      Tricep reflexes are 2+ on the right side and 2+ on the left side.      Bicep reflexes are 2+ on the right side and 2+ on the left side.      Brachioradialis reflexes are 2+ on the right side and 2+ on the left side.      Patellar reflexes are 2+ on the right side and 2+ on the left side. Skin: Skin is warm, dry and intact. No bruising, no petechiae and no rash noted. She is not diaphoretic. No cyanosis or erythema. No pallor. Nails show no clubbing.  Psychiatric: She has a normal mood and affect. Her speech is normal and behavior is normal. Judgment and thought content normal. Cognition and memory are normal.    Results for orders placed in visit on 04/18/13  IFOBT (OCCULT BLOOD)      Result Value Ref Range   IFOBT Positive        Assessment & Plan:  Routine general medical examination at a health care facility - Plan: IFOBT POC (occult bld, rslt in office), CBC with Differential, COMPLETE METABOLIC PANEL WITH GFR, Lipid panel  HTN (hypertension) - Continue current medication.  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 She will seek treatment if symptoms worsen.  Need for prophylactic vaccination with tetanus-diphtheria (TD) - Plan: Td vaccine greater than or equal to 7yo preservative free IM

## 2013-04-18 NOTE — Telephone Encounter (Signed)
Depression screening core= 13. Pt states she has some symptoms of mild depression but does not think she needs treatment at this time. She feels down just some of the time. There is a family hx of depression/ anxiety. She will come in too discuss this further if symptoms worsen.  Pt noted that IFOBT result was +. I thanked her for bringing that to my attention; she left the office before I saw that result. I asked that she come in and pick up a Hemosure for home testing. She will pick it up on Thursday and return it next week to [facility].

## 2013-04-18 NOTE — Patient Instructions (Signed)
Keeping You Healthy  Get These Tests  Blood Pressure- Have your blood pressure checked by your healthcare provider at least once a year.  Normal blood pressure is 120/80.  Weight- Have your body mass index (BMI) calculated to screen for obesity.  BMI is a measure of body fat based on height and weight.  You can calculate your own BMI at https://www.west-esparza.com/www.[email or web address]/bmi/  Cholesterol- Have your cholesterol checked every year.  Diabetes- Have your blood sugar checked every year if you have high blood pressure, high cholesterol, a family history of diabetes or if you are overweight.  Pap Smear- Have a pap smear every 1 to 3 years if you have been sexually active.  If you are older than 65 and recent pap smears have been normal you may not need additional pap smears.  In addition, if you have had a hysterectomy  For benign disease additional pap smears are not necessary.  Mammogram-Yearly mammograms are essential for early detection of breast cancer  Screening for Colon Cancer- Colonoscopy starting at age 73. Screening may begin sooner depending on your family history and other health conditions.  Follow up colonoscopy as directed by your Gastroenterologist.  Screening for Osteoporosis- Screening begins at age 73 with bone density scanning, sooner if you are at higher risk for developing Osteoporosis.  Get these medicines  Calcium with Vitamin D- Your body requires 1200-1500 mg of Calcium a day and [phone] IU of Vitamin D a day.  You can only absorb 500 mg of Calcium at a time therefore Calcium must be taken in 2 or 3 separate doses throughout the day.  Hormones- Hormone therapy has been associated with increased risk for certain cancers and heart disease.  Talk to your healthcare provider about if you need relief from menopausal symptoms.  Aspirin- Ask your healthcare provider about taking Aspirin to prevent Heart Disease and Stroke.  Get these Immuniztions  Flu shot- Every fall  Pneumonia  shot- Once after the age of 73; if you are younger ask your healthcare provider if you need a pneumonia shot.  Tetanus- Every ten years. Tetanus given today. (medicare will not cover Tdap).  Zostavax- Once after the age of 73 to prevent shingles.  Take these steps  Don't smoke- Your healthcare provider can help you quit. For tips on how to quit, ask your healthcare provider or go to www.smokefree.gov or call 1-800 QUIT-NOW.  Be physically active- Exercise 5 days a week for a minimum of 30 minutes.  If you are not already physically active, start slow and gradually work up to 30 minutes of moderate physical activity.  Try walking, dancing, bike riding, swimming, etc.  Eat a healthy diet- Eat a variety of healthy foods such as fruits, vegetables, whole grains, low fat milk, low fat cheeses, yogurt, lean meats, chicken, fish, eggs, dried beans, tofu, etc.  For more information go to www.thenutritionsource.org  Dental visit- Brush and floss teeth twice daily; visit your dentist twice a year.  Eye exam- Visit your Optometrist or Ophthalmologist yearly.  Drink alcohol in moderation- Limit alcohol intake to one drink or less a day.  Never drink and drive.  Depression- Your emotional health is as important as your physical health.  If you're feeling down or losing interest in things you normally enjoy, please talk to your healthcare provider.  Seat Belts- can save your life; always wear one  Smoke/Carbon Monoxide detectors- These detectors need to be installed on the appropriate level of your home.  Replace batteries at  least once a year.  Violence- If anyone is threatening or hurting you, please tell your healthcare provider.  Living Will/ Health care power of attorney- Discuss with your healthcare provider and family.

## 2013-04-19 ENCOUNTER — Other Ambulatory Visit: Payer: Self-pay | Admitting: Radiology

## 2013-04-19 DIAGNOSIS — K921 Melena: Secondary | ICD-10-CM

## 2013-04-19 LAB — COMPLETE METABOLIC PANEL WITH GFR
ALT: 11 U/L (ref 0–35)
AST: 16 U/L (ref 0–37)
Albumin: 4 g/dL (ref 3.5–5.2)
Alkaline Phosphatase: 74 U/L (ref 39–117)
BILIRUBIN TOTAL: 0.6 mg/dL (ref 0.2–1.2)
BUN: 6 mg/dL (ref 6–23)
CALCIUM: 9.1 mg/dL (ref 8.4–10.5)
CO2: 27 mEq/L (ref 19–32)
Chloride: 107 mEq/L (ref 96–112)
Creat: 0.51 mg/dL (ref 0.50–1.10)
GFR, Est African American: >89 mL/min
GLUCOSE: 103 mg/dL — AB (ref 70–99)
Potassium: 4.5 mEq/L (ref 3.5–5.3)
Sodium: 141 mEq/L (ref 135–145)
Total Protein: 6.2 g/dL (ref 6.0–8.3)

## 2013-04-19 LAB — LIPID PANEL
Cholesterol: 184 mg/dL (ref 0–200)
HDL: 69 mg/dL (ref >39)
LDL Cholesterol: 100 mg/dL — ABNORMAL HIGH (ref 0–99)
Total CHOL/HDL Ratio: 2.7 Ratio
Triglycerides: 73 mg/dL (ref <150)
VLDL: 15 mg/dL (ref 0–40)

## 2013-04-19 NOTE — Progress Notes (Signed)
Quick Note:  Please notify pt that results are normal.   Provide pt with copy of labs. ______ 

## 2013-04-20 ENCOUNTER — Encounter: Payer: Self-pay | Admitting: Radiology

## 2013-04-24 LAB — IFOBT (OCCULT BLOOD): IFOBT: NEGATIVE

## 2013-04-24 NOTE — Addendum Note (Signed)
Addended by: Gloriann LoanPAYNE, Marria Mathison L on: 04/24/2013 11:13 AM   Modules accepted: Orders

## 2013-08-23 ENCOUNTER — Other Ambulatory Visit: Payer: Self-pay | Admitting: Family Medicine

## 2013-10-10 ENCOUNTER — Encounter: Payer: Self-pay | Admitting: Family Medicine

## 2013-10-10 ENCOUNTER — Ambulatory Visit (INDEPENDENT_AMBULATORY_CARE_PROVIDER_SITE_OTHER): Payer: Medicare Other | Admitting: Family Medicine

## 2013-10-10 VITALS — BP 172/70 | HR 66 | Temp 98.0°F | Resp 16 | Ht <= 58 in | Wt 86.0 lb

## 2013-10-10 DIAGNOSIS — Z780 Asymptomatic menopausal state: Secondary | ICD-10-CM

## 2013-10-10 DIAGNOSIS — Z789 Other specified health status: Secondary | ICD-10-CM

## 2013-10-10 DIAGNOSIS — Z23 Encounter for immunization: Secondary | ICD-10-CM

## 2013-10-10 DIAGNOSIS — Z13820 Encounter for screening for osteoporosis: Secondary | ICD-10-CM

## 2013-10-10 DIAGNOSIS — Z9189 Other specified personal risk factors, not elsewhere classified: Secondary | ICD-10-CM

## 2013-10-10 DIAGNOSIS — I10 Essential (primary) hypertension: Secondary | ICD-10-CM

## 2013-10-10 MED ORDER — METOPROLOL TARTRATE 25 MG PO TABS: ORAL_TABLET | ORAL | Status: DC

## 2013-10-10 NOTE — Progress Notes (Signed)
S:  This 73 y.o. Cauc female has HTN; BP readings at home= 130-140/70-80. Pt is compliant w/ medication and is asymptomatic. She is able to perform all ADLs and stays active on daily basis. She has occasional mild fatigue. Her appetite is stable and sleep hygiene is good. Pt inquires about DEXA study which has not been completed due to "problem with diagnosis". She has never had bone density study.  Patient Active Problem List   Diagnosis Date Noted  . History of wrist fracture 04/18/2013  . Seborrheic keratosis 12/03/2011  . HTN (hypertension) 08/06/2011  . Anxiety 08/06/2011  . GERD (gastroesophageal reflux disease) 08/06/2011  . History of tobacco use 08/06/2011    Prior to Admission medications   Medication Sig Start Date End Date Taking? Authorizing Provider  metoprolol tartrate (LOPRESSOR) 25 MG tablet TAKE 1 TABLET TWICE A DAY   Yes Maurice March, MD  OVER THE COUNTER MEDICATION OTC Allway eye drops for allergies prn   Yes Historical Provider, MD  Polyethyl Glycol-Propyl Glycol (SYSTANE OP) Apply 1 drop to eye 2 (two) times daily as needed.     Yes Historical Provider, MD  Ranitidine HCl (ZANTAC 75 PO) Take 1 tablet by mouth as needed.     Yes Historical Provider, MD   History   Social History  . Marital Status: Single    Spouse Name: N/A    Number of Children: N/A  . Years of Education: N/A   Occupational History  . retired [employer]   Social History Main Topics  . Smoking status: Former Games developer  . Smokeless tobacco: Not on file  . Alcohol Use: No  . Drug Use: No  . Sexual Activity:    Other Topics Concern  . Not on file    Family History  Problem Relation Age of Onset  . Cancer Father 49    pancreatic cancer  . Breast cancer Sister   . Hypertension Sister   . Ovarian cancer Sister   . Hypertension Brother   . Stroke Mother     ROS: As per HPI.  O: Filed Vitals:   10/10/13 1406  BP: 172/70  Pulse: 66  Temp: 98 F (36.7 C)   Resp: 16    GEN: In NAD; WN,WD. Weight down 2-3 lbs. HENT: [place]/AT; EOMI w/ clear conj/sclerae. Otherwise unremarkable. COR: RRR. LUNGS: Unlabored resp.Marland Kitchen SKIN: W&D; intact w/o erythema or pallor. NEURO: A&O x 3; CNs intact. Nonfocal.  A/P: Essential hypertension- Stable; continue Metoprolol bid.  Need for prophylactic vaccination and inoculation against influenza - Plan: Flu Vaccine QUAD 36+ mos IM  Screening for osteoporosis- Advised MVI and Calcium + D supplement.   Post-menopausal  At high risk for osteoporosis- Check into [facility] which may be free to [facility] recipients.  Meds ordered this encounter  Medications  . metoprolol tartrate (LOPRESSOR) 25 MG tablet    Sig: TAKE 1 TABLET TWICE A DAY    Dispense:  60 tablet    Refill:  11

## 2013-10-10 NOTE — Patient Instructions (Signed)
I have recommended you get a multivitamin (try one of the new Adult Gummies). Also, consider adding a meal supplement, such as BOOST or Ensure Complete which has some additional protein to preserve muscle mass.  I will see you in March for annual Medicare CPE and labs.

## 2013-10-12 ENCOUNTER — Other Ambulatory Visit: Payer: Self-pay | Admitting: Family Medicine

## 2013-10-12 DIAGNOSIS — E2839 Other primary ovarian failure: Secondary | ICD-10-CM

## 2013-10-13 ENCOUNTER — Other Ambulatory Visit: Payer: Self-pay | Admitting: Family Medicine

## 2013-10-13 DIAGNOSIS — E2839 Other primary ovarian failure: Secondary | ICD-10-CM

## 2013-10-25 ENCOUNTER — Other Ambulatory Visit: Payer: Self-pay

## 2013-10-25 ENCOUNTER — Other Ambulatory Visit: Payer: Self-pay | Admitting: Family Medicine

## 2013-10-25 DIAGNOSIS — E2839 Other primary ovarian failure: Secondary | ICD-10-CM

## 2013-10-26 ENCOUNTER — Other Ambulatory Visit: Payer: Self-pay | Admitting: Family Medicine

## 2013-10-26 ENCOUNTER — Other Ambulatory Visit: Payer: Self-pay

## 2013-10-26 DIAGNOSIS — Z780 Asymptomatic menopausal state: Secondary | ICD-10-CM

## 2013-11-06 ENCOUNTER — Other Ambulatory Visit: Payer: Self-pay | Admitting: Family Medicine

## 2013-11-06 DIAGNOSIS — E2839 Other primary ovarian failure: Secondary | ICD-10-CM

## 2013-11-22 ENCOUNTER — Ambulatory Visit
Admission: RE | Admit: 2013-11-22 | Discharge: 2013-11-22 | Disposition: A | Discharge status: Home / Self Care | Payer: Medicare Other | Source: Ambulatory Visit | Attending: Family Medicine | Admitting: Family Medicine

## 2013-11-22 DIAGNOSIS — E2839 Other primary ovarian failure: Secondary | ICD-10-CM

## 2013-11-23 ENCOUNTER — Telehealth: Payer: Self-pay | Admitting: Family Medicine

## 2013-11-23 NOTE — Telephone Encounter (Signed)
I phoned pt to discuss DEXA results; she has no hx of osteoporosis nor has she taken any prescription medication for this condition. There is an abnormal area in L2 (suspicious for metastatic lesion; pt has no hx of malignancy). I will discuss this finding at the OV; I did not tell the pt the exact wording of the radiology report.

## 2013-11-28 ENCOUNTER — Telehealth: Payer: Self-pay | Admitting: Family Medicine

## 2013-11-28 NOTE — Telephone Encounter (Signed)
-----   Message from Maurice MarchBarbara B McPherson, MD sent at 11/23/2013  7:30 PM EDT ----- Please sch this lady to see me during the 2nd week of November. It is to review bone density study and to do xrays of lumbar spine.  Thank you.

## 2013-11-28 NOTE — Telephone Encounter (Signed)
Pt notified of follow up  appointment for Nov. 4th. Unable to schedule 2nd week in Nov due to schedule conflicts.

## 2013-11-29 ENCOUNTER — Ambulatory Visit (INDEPENDENT_AMBULATORY_CARE_PROVIDER_SITE_OTHER): Payer: Medicare Other

## 2013-11-29 ENCOUNTER — Ambulatory Visit (INDEPENDENT_AMBULATORY_CARE_PROVIDER_SITE_OTHER): Payer: Medicare Other | Admitting: Family Medicine

## 2013-11-29 ENCOUNTER — Encounter: Payer: Self-pay | Admitting: Family Medicine

## 2013-11-29 VITALS — BP 190/82 | HR 68 | Temp 98.0°F | Resp 16 | Ht 58.25 in | Wt 85.8 lb

## 2013-11-29 DIAGNOSIS — R937 Abnormal findings on diagnostic imaging of other parts of musculoskeletal system: Secondary | ICD-10-CM

## 2013-11-29 DIAGNOSIS — M810 Age-related osteoporosis without current pathological fracture: Secondary | ICD-10-CM

## 2013-11-29 IMAGING — CR DG LUMBAR SPINE COMPLETE 4+V
6 series · 6 of 6 positions shown · non-contrast
Comparison: None.

CLINICAL DATA: Osteoporosis

EXAM:
LUMBAR SPINE - COMPLETE 4+ VIEW

[other]
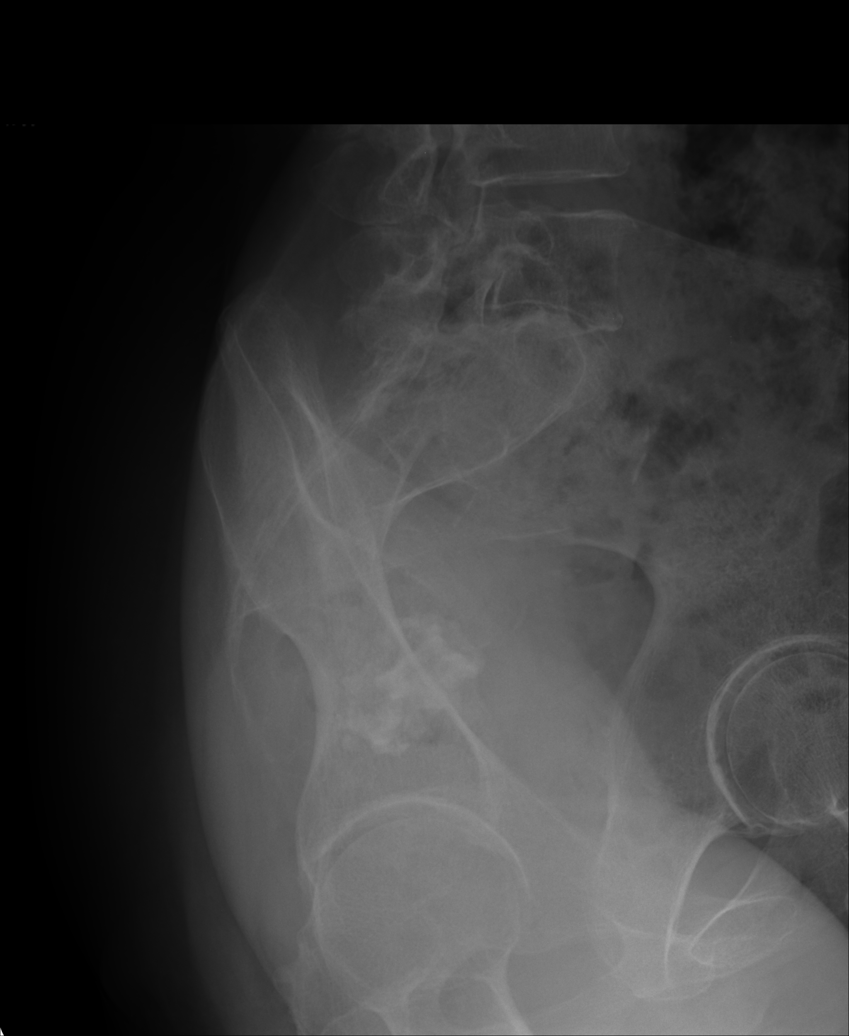

[left lateral]
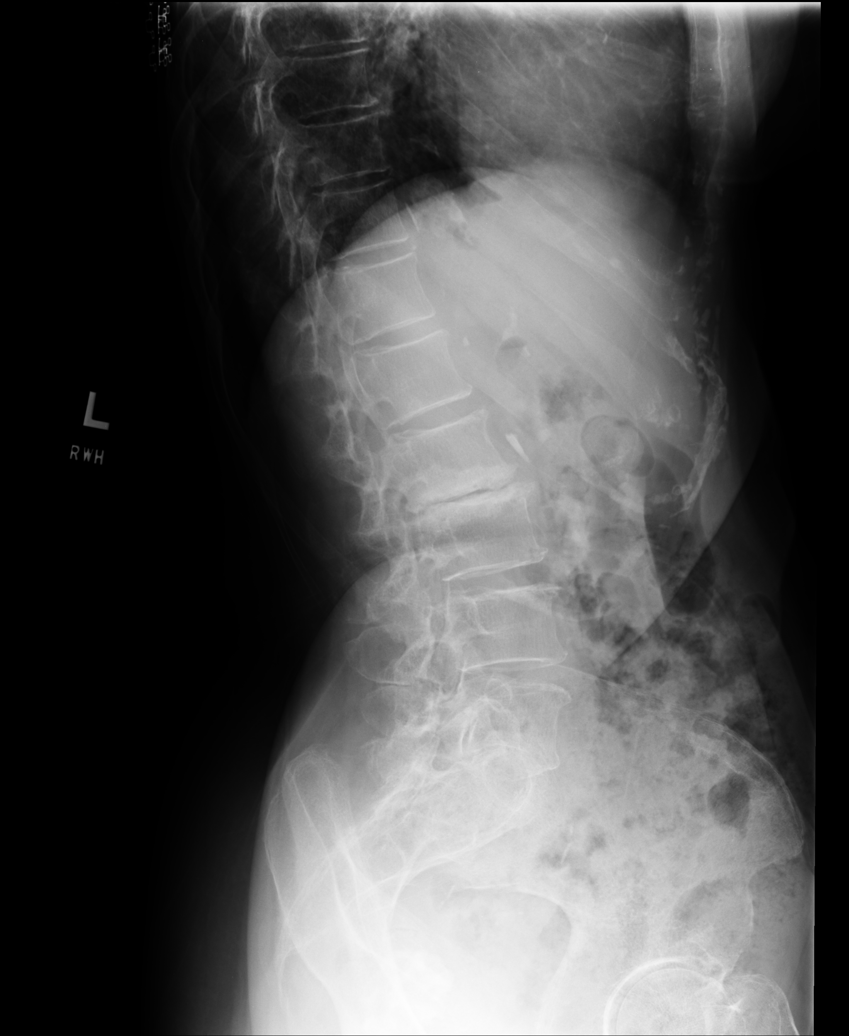

[oblique (1 of 2)]
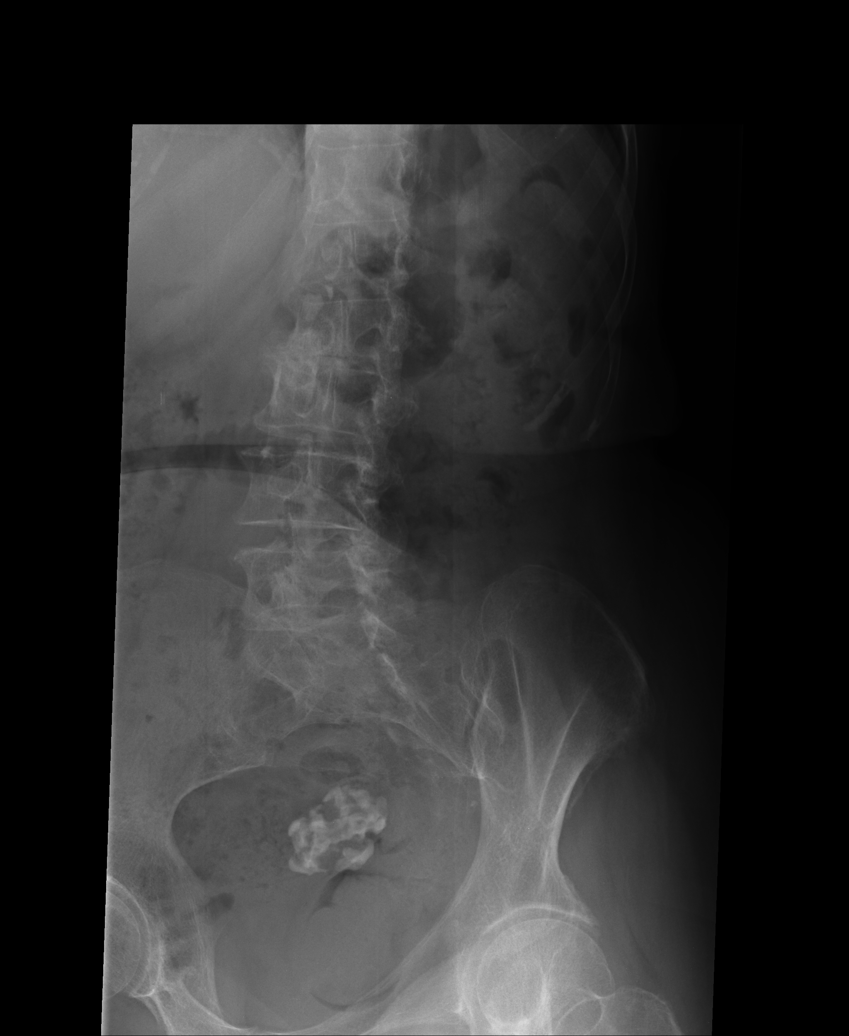

[AP]
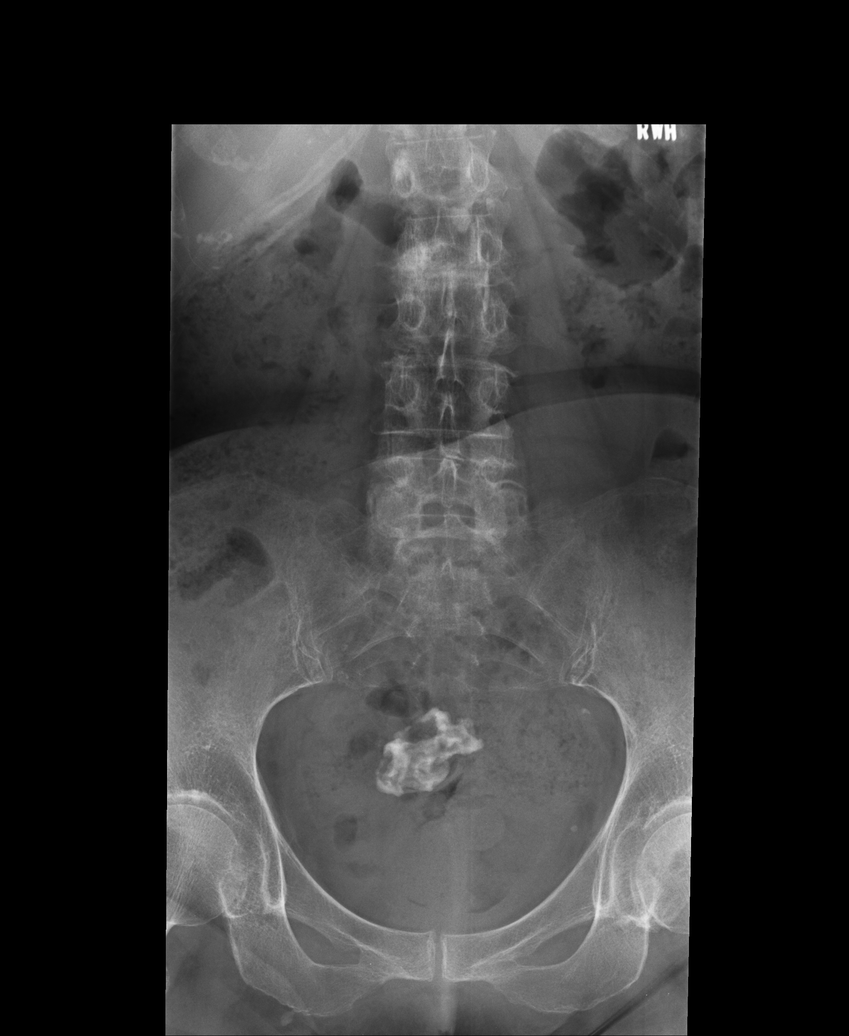

[lateral]
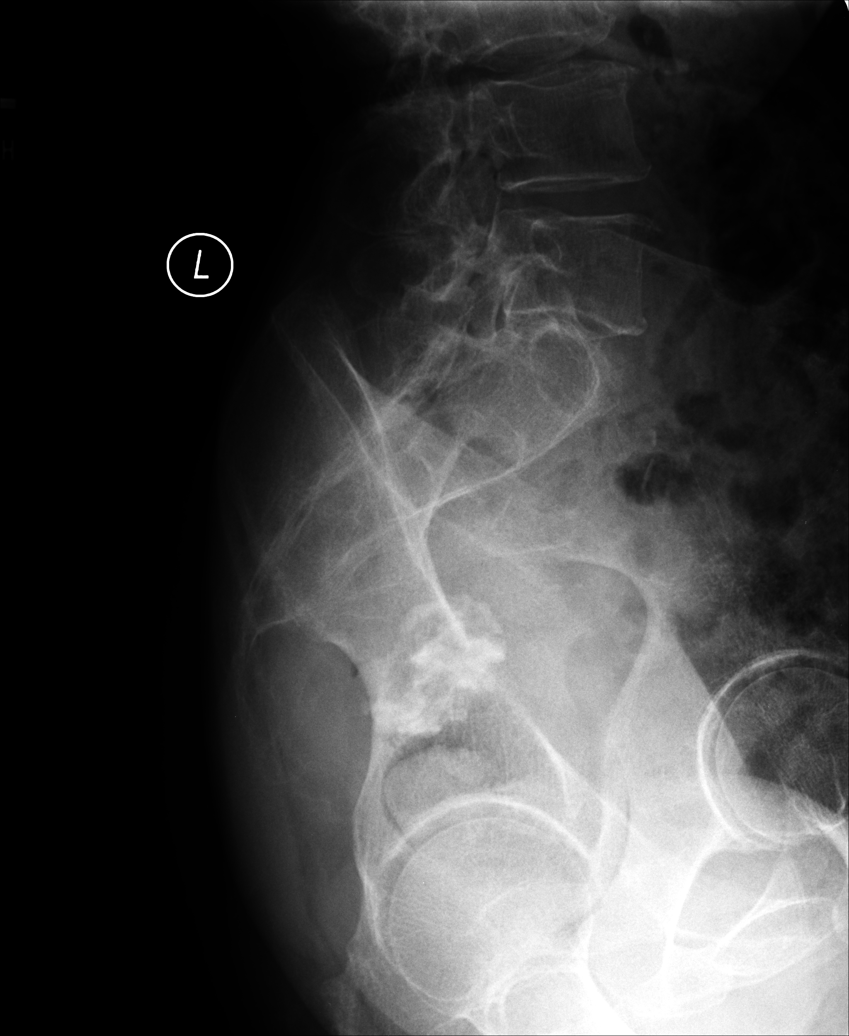

[oblique (2 of 2)]
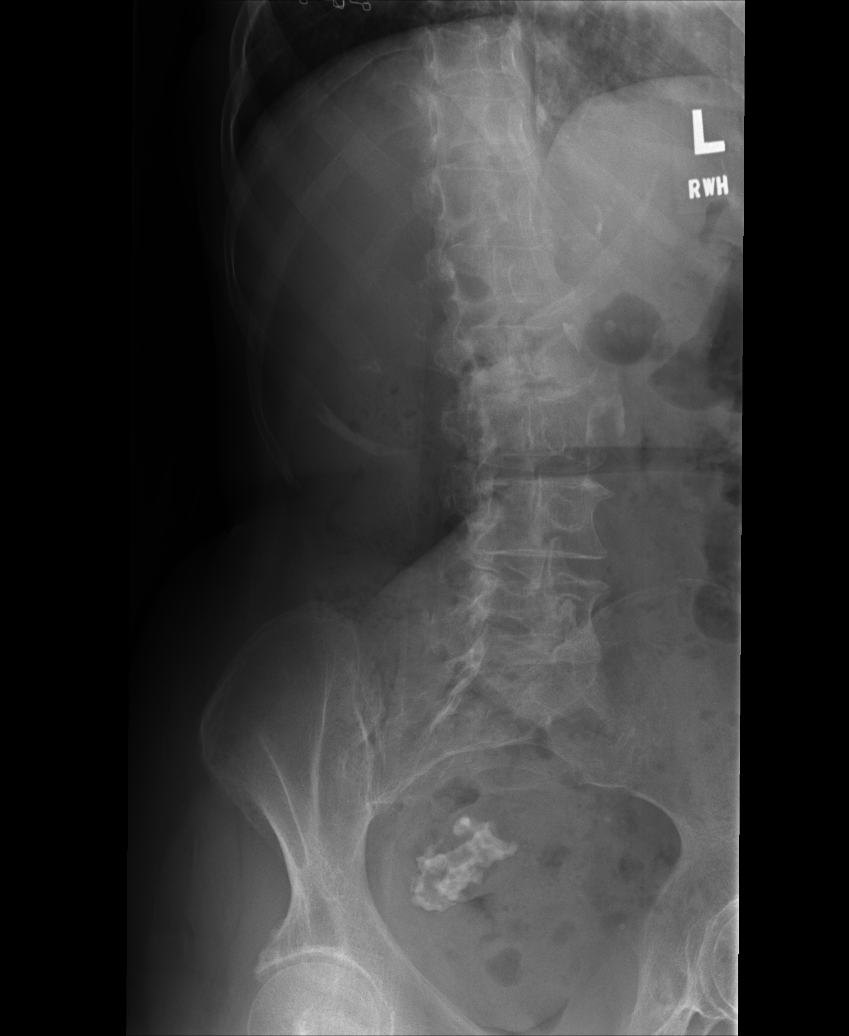

[6 of 6 positions shown; findings below may reference images not displayed]

FINDINGS: There are 5 nonrib bearing lumbar-type vertebral bodies. The
vertebral body heights are maintained. The alignment is anatomic.
There is no spondylolysis. There is no acute fracture or static
listhesis. Severe degenerative disc disease at L2-3.

The SI joints are unremarkable.

Calcified mass in the pelvis likely representing a uterine fibroid.
There is abdominal aortic atherosclerosis.
IMPRESSION: 1. Severe degenerative disc disease at L2-3.
2. Calcified uterine fibroid.

## 2013-11-29 NOTE — Progress Notes (Signed)
S:  This 73 y.o. Cauc female is here to discuss DEXA results. This is her first bone density study; her sister has osteoporosis for which she takes medication. She reports occasional non-radiating low back pain after prolonged sitting. No hx of bone pain. She is reluctant to take  medication for osteoporosis but is interested in more information about bisphosphonates.  Pt has HTN and is compliant w/ medication. She checks BP at home (< 1 time/week); SBP readings= 140. Pt is asymptomatic.   Patient Active Problem List   Diagnosis Date Noted  . At high risk for osteoporosis 10/10/2013  . History of wrist fracture 04/18/2013  . Seborrheic keratosis 12/03/2011  . HTN (hypertension) 08/06/2011  . Anxiety 08/06/2011  . GERD (gastroesophageal reflux disease) 08/06/2011  . History of tobacco use 08/06/2011    Prior to Admission medications   Medication Sig Start Date End Date Taking? Authorizing Provider  metoprolol tartrate (LOPRESSOR) 25 MG tablet TAKE 1 TABLET TWICE A DAY 10/10/13  Yes Maurice MarchBarbara B Jamila Slatten, MD  OVER THE COUNTER MEDICATION OTC Allway eye drops for allergies prn   Yes Historical Provider, MD  Ranitidine HCl (ZANTAC 75 PO) Take 1 tablet by mouth as needed.     Yes Historical Provider, MD  Polyethyl Glycol-Propyl Glycol (SYSTANE OP) Apply 1 drop to eye 2 (two) times daily as needed.      Historical Provider, MD    History   Social History  . Marital Status: Single    Spouse Name: N/A    Number of Children: N/A  . Years of Education: N/A   Occupational History  . retired [employer]   Social History Main Topics  . Smoking status: Former Games developermoker  . Smokeless tobacco: Not on file  . Alcohol Use: No  . Drug Use: No  . Sexual Activity: Not on file   Other Topics Concern  . Not on file   Social History Narrative    Family History  Problem Relation Age of Onset  . Cancer Father 7570    pancreatic cancer  . Breast cancer Sister   . Hypertension Sister   .  Ovarian cancer Sister   . Hypertension Brother   . Stroke Mother     ROS: Negative for fatigue, diaphoresis, abnormal weight change, CP or tightness, SOB or DOE, cough, GI distress, myalgias, HA, dizziness, weakness, numbness or syncope.   O: Filed Vitals:   11/29/13 0810  BP: 190/82  Pulse: 68  Temp: 98 F (36.7 C)  Resp: 16    GEN: In NAD; WN,WD.  HENT: [place]/AT; EOMI w/ clear conj/sclerae. Otherwise unremarkable. COR: RRR. No edema. LUNGS: Normal resp rate and effort. SKIN: W&D; intact w/o diaphoresis or erythema. BACK: Spine straight w/o spinous process tenderness or muscle spasms. No SI joint tenderness.  MS: MAEs; no deformities or muscle atrophy. NEURO: A&O x 3; CNs intact. Nonfocal.  11/22/2013 DEXA Study: Osteoporosis w/ T-score -3.2 in lumbar spine and -3.4 in femur. Focal sclerotic lesion in L2, suspicious for metastasis. (Pt has no hx of cancer).   [facility] reading (PRIMARY) by  Dr. Audria NineMcPherson: Lumbar spine- Degenerative changes in lower lumbar vertebrae most prominent at L2-L3 and L5-S1. Sclerotic lesion (seen on bone density study) seen on 1 view. Calcified lesion seen in pelvis. Decreased bone density.   A/P: Abnormal x-ray of lumbar spine - I will contact pt w/ radiologist impression of xray; she will return in 2 weeks to discuss further evaluation as warranted. Plan: DG Lumbar Spine  Complete  Osteoporosis - Pt advised to start OTC Calcium + Vitamin D supplement; also given information about Alendronate (representative of bisphosphonate class) as possible treatment for osteoporosis.

## 2013-11-29 NOTE — Patient Instructions (Addendum)
I have reviewed your xrays but await the official impression from the radiologist. I will call you by Friday to discuss treatment plan and follow-up.  For osteoporosis, you should start taking CITRACAL +D or OSCAL + D. The dose is 1 tablet twice a day. You have decide that you want to read more about the medications that are used to treat osteoporosis.   Osteoporosis Osteoporosis happens when your bones become weak because of bone loss. Weak bones can break (fracture) more easily with slips or falls. You are more likely to develop osteoporosis if:  You are a woman.  You are older than 50 years.  You are white or Asian.  You are very thin.  Someone in your family has had osteoporosis.  You smoke or use nicotine. CAUSES   Smoking.  Too much drinking.  Being a weight below normal.  Not being active.  Not going outside in the sun enough.  Certain medical conditions, such as diabetes or Crohn disease.  Certain medicines, such as steroids or antiseizure medicines. TREATMENT  The goal of treatment is to strengthen bones. There are different types of medicines that help your bones. Some medicines make your bones more solid. Some medicines help to slow down how much bone you lose. Your doctor may check to see if you are getting enough calcium and vitamin D in your diet. PREVENTION   Make sure you get enough calcium and vitamin D.  Make sure you exercise often.  If you smoke, quit. MAKE SURE YOU:  Understand these instructions.  Will watch your condition.  Will get help right away if you are not doing well or get worse. Document Released: 04/06/2011 Document Reviewed: 04/06/2011 [facility] Patient Information 2015 [place], [place]. This information is not intended to replace advice given to you by your health care provider. Make sure you discuss any questions you have with your health care provider.   Alendronate weekly tablets What is this medicine? ALENDRONATE (a LEN  droe nate) slows calcium loss from bones. It helps to make healthy bone and to slow bone loss in people with osteoporosis. It may be used to treat Paget's disease. This medicine may be used for other purposes; ask your health care provider or pharmacist if you have questions. COMMON BRAND NAME(S): Fosamax What should I tell my health care provider before I take this medicine? They need to know if you have any of these conditions: -esophagus, stomach, or intestine problems, like acid-reflux or GERD -dental disease -kidney disease -low blood calcium -low vitamin D -problems swallowing -problems sitting or standing for 30 minutes -an unusual or allergic reaction to alendronate, other medicines, foods, dyes, or preservatives -pregnant or trying to get pregnant -breast-feeding How should I use this medicine? You must take this medicine exactly as directed or you will lower the amount of medicine you absorb into your body or you may cause yourself harm. Take your dose by mouth first thing in the morning, after you are up for the day. Do not eat or drink anything before you take this medicine. Swallow your medicine with a full glass (6 to 8 fluid ounces) of plain water. Do not take this tablet with any other drink. Do not chew or crush the tablet. After taking this medicine, do not eat breakfast, drink, or take any medicines or vitamins for at least 30 minutes. Stand or sit up for at least 30 minutes after you take this medicine; do not lie down. Take this medicine on the same  day every week. Do not take your medicine more often than directed. Talk to your pediatrician regarding the use of this medicine in children. Special care may be needed. Overdosage: If you think you have taken too much of this medicine contact a poison control center or emergency room at once. NOTE: This medicine is only for you. Do not share this medicine with others. What if I miss a dose? If you miss a dose, take the dose on  the morning after you remember. Then take your next dose on your regular day of the week. Never take 2 tablets on the same day. Do not take double or extra doses. What may interact with this medicine? -aluminum hydroxide -antacids -aspirin -calcium supplements -drugs for inflammation like ibuprofen, naproxen, and others -iron supplements -magnesium supplements -vitamins with minerals This list may not describe all possible interactions. Give your health care provider a list of all the medicines, herbs, non-prescription drugs, or dietary supplements you use. Also tell them if you smoke, drink alcohol, or use illegal drugs. Some items may interact with your medicine. What should I watch for while using this medicine? Visit your doctor or health care professional for regular checks ups. It may be some time before you see benefit from this medicine. Do not stop taking your medication except on your doctor's advice. Your doctor or health care professional may order blood tests and other tests to see how you are doing. You should make sure you get enough calcium and vitamin D while you are taking this medicine, unless your doctor tells you not to. Discuss the foods you eat and the vitamins you take with your health care professional. Some people who take this medicine have severe bone, joint, and/or muscle pain. This medicine may also increase your risk for a broken thigh bone. Tell your doctor right away if you have pain in your upper leg or groin. Tell your doctor if you have any pain that does not go away or that gets worse. This medicine can make you more sensitive to the sun. If you get a rash while taking this medicine, sunlight may cause the rash to get worse. Keep out of the sun. If you cannot avoid being in the sun, wear protective clothing and use sunscreen. Do not use sun lamps or tanning beds/booths. What side effects may I notice from receiving this medicine? Side effects that you should  report to your doctor or health care professional as soon as possible: -allergic reactions like skin rash, itching or hives, swelling of the face, lips, or tongue -black or tarry stools -bone, muscle or joint pain -changes in vision -chest pain -heartburn or stomach pain -jaw pain, especially after dental work -pain or trouble when swallowing -redness, blistering, peeling or loosening of the skin, including inside the mouth Side effects that usually do not require medical attention (report to your doctor or health care professional if they continue or are bothersome): -changes in taste -diarrhea or constipation -eye pain or itching -headache -nausea or vomiting -stomach gas or fullness This list may not describe all possible side effects. Call your doctor for medical advice about side effects. You may report side effects to FDA at 1-800-FDA-1088. Where should I keep my medicine? Keep out of the reach of children. Store at room temperature of 15 and 30 degrees C (59 and 86 degrees F). Throw away any unused medicine after the expiration date. NOTE: This sheet is a summary. It may not cover all possible information.  If you have questions about this medicine, talk to your doctor, pharmacist, or health care provider.  2015, Elsevier/Gold Standard. (2010-11-27 09:02:42)

## 2013-11-30 ENCOUNTER — Telehealth: Payer: Self-pay | Admitting: Family Medicine

## 2013-11-30 NOTE — Telephone Encounter (Signed)
I phoned pt to review radiology impression of lumbar films; L2-L3 DDD confirmed but no mention of L2 sclerotic lesion. Pt advised about calcified uterine fibroid. She will follow-up in 2 weeks as scheduled or sooner if she has new symptoms.

## 2013-12-13 ENCOUNTER — Encounter: Payer: Self-pay | Admitting: Family Medicine

## 2013-12-13 ENCOUNTER — Ambulatory Visit (INDEPENDENT_AMBULATORY_CARE_PROVIDER_SITE_OTHER): Payer: Medicare Other | Admitting: Family Medicine

## 2013-12-13 VITALS — BP 130/62 | HR 56 | Temp 97.3°F | Resp 16 | Ht <= 58 in | Wt 86.0 lb

## 2013-12-13 DIAGNOSIS — M810 Age-related osteoporosis without current pathological fracture: Secondary | ICD-10-CM

## 2013-12-13 MED ORDER — ALENDRONATE SODIUM 70 MG PO TABS: 70.0000 mg | ORAL_TABLET | ORAL | Status: DC

## 2013-12-13 NOTE — Patient Instructions (Signed)
Osteoporosis Osteoporosis happens when your bones become weak because of bone loss. Weak bones can break (fracture) more easily with slips or falls. You are more likely to develop osteoporosis if:  You are a woman.  You are older than 50 years.  You are white or Asian.  You are very thin.  Someone in your family has had osteoporosis.  You smoke or use nicotine. CAUSES   Smoking.  Too much drinking.  Being a weight below normal.  Not being active.  Not going outside in the sun enough.  Certain medical conditions, such as diabetes or Crohn disease.  Certain medicines, such as steroids or antiseizure medicines. TREATMENT  The goal of treatment is to strengthen bones. There are different types of medicines that help your bones. Some medicines make your bones more solid. Some medicines help to slow down how much bone you lose. Your doctor may check to see if you are getting enough calcium and vitamin D in your diet. PREVENTION   Make sure you get enough calcium and vitamin D.  Make sure you exercise often.  If you smoke, quit. MAKE SURE YOU:  Understand these instructions.  Will watch your condition.  Will get help right away if you are not doing well or get worse. Document Released: 04/06/2011 Document Reviewed: 04/06/2011 [employer] Patient Information 2015 [employer]. This information is not intended to replace advice given to you by your health care provider. Make sure you discuss any questions you have with your health care provider.  

## 2013-12-13 NOTE — Progress Notes (Signed)
S:  This 73 y.o. Cauc female is here to discuss findings on recent DEXA and lumbar spine plain films. She has osteoporosis as do both her sisters. She has been reluctant to take Fosamax which is the medication that her sisters take. I discussed finding on DEXA that indicated sclerotic lesion in L2 w/ suggestion that pt have plain films of lumbar spine. She did come into office on 11/29/2013 for these xrays which revealed DDD of lumbar spine, most severe at L2-L3. No mention of L2 sclerotic lesion but calcified fibroid visible in pelvis. (These films are shown to pt; she has better understanding of abnormalities).   Pt has no hx of back trauma but does have mild intermittent LBP w/o radiation. She is open to taking Fosamax, understanding that Calcium + D is inadequate for treatment. She is at high risk for fractures. Pt researched Fosamax before coming to day and is aware of administration and side effects; no contraindications. She has no hx of GERD or dysphagia but, if pill is to big, she may not be able to take it.  Patient Active Problem List   Diagnosis Date Noted  . Osteoporosis 11/29/2013  . History of wrist fracture 04/18/2013  . Seborrheic keratosis 12/03/2011  . HTN (hypertension) 08/06/2011  . Anxiety 08/06/2011  . GERD (gastroesophageal reflux disease) 08/06/2011  . History of tobacco use 08/06/2011    Prior to Admission medications   Medication Sig Start Date End Date Taking? Authorizing Provider  metoprolol tartrate (LOPRESSOR) 25 MG tablet TAKE 1 TABLET TWICE A DAY 10/10/13  Yes Maurice MarchBarbara B Shayanne Gomm, MD  OVER THE COUNTER MEDICATION OTC Allway eye drops for allergies prn   Yes Historical Provider, MD  Polyethyl Glycol-Propyl Glycol (SYSTANE OP) Apply 1 drop to eye 2 (two) times daily as needed.     Yes Historical Provider, MD  Ranitidine HCl (ZANTAC 75 PO) Take 1 tablet by mouth as needed.     Yes Historical Provider, MD    History   Social History  . Marital Status: Single   Spouse Name: N/A    Number of Children: N/A  . Years of Education: N/A   Occupational History  . retired [employer]   Social History Main Topics  . Smoking status: Former Games developermoker  . Smokeless tobacco: Not on file  . Alcohol Use: No  . Drug Use: No  . Sexual Activity: Not on file   Other Topics Concern  . Not on file   Social History Narrative    Family History  Problem Relation Age of Onset  . Cancer Father [id number]    pancreatic cancer  . Breast cancer Sister   . Hypertension Sister   . Ovarian cancer Sister   . Hypertension Brother   . Stroke Mother   . Osteoporosis Sister     2 sisters on Fosamax    ROS; AS per HPI; otherwise noncontributory.  O: Filed Vitals:   12/13/13 1000  BP: 130/62  Pulse: 56  Temp: 97.3 F (36.3 C)  Resp: 16    GEN: In NAD; WN,WD. Small stature. HENT: [place]/AT; EOMI w/ clear conj/sclerae otherwise unremarkable. COR: RRR. LUNGS: Unlabored resp. SKIN: W&D; intact w/o erythema or pallor. BACK: Spine is straight and NT with palpation of spinous processes.  NEURO: A&O x 3; CNs intact. Nonfocal.  A/P: Osteoporosis- Trial Fosamax 70 mg  1 tablet once a week. DEXA can be repeated in 2 years. Consider re-imaging LS spine if LBP increase (may need MRI).  Meds ordered this encounter  Medications  . alendronate (FOSAMAX) 70 MG tablet    Sig: Take 1 tablet (70 mg total) by mouth every 7 (seven) days. Take with a full glass of water on an empty stomach.    Dispense:  4 tablet    Refill:  11

## 2014-04-24 ENCOUNTER — Ambulatory Visit (INDEPENDENT_AMBULATORY_CARE_PROVIDER_SITE_OTHER): Payer: Medicare Other | Admitting: Family Medicine

## 2014-04-24 ENCOUNTER — Encounter: Payer: Self-pay | Admitting: Family Medicine

## 2014-04-24 VITALS — BP 164/60 | HR 58 | Temp 97.9°F | Resp 16 | Ht <= 58 in | Wt 86.2 lb

## 2014-04-24 DIAGNOSIS — M810 Age-related osteoporosis without current pathological fracture: Secondary | ICD-10-CM | POA: Diagnosis not present

## 2014-04-24 DIAGNOSIS — L989 Disorder of the skin and subcutaneous tissue, unspecified: Secondary | ICD-10-CM | POA: Diagnosis not present

## 2014-04-24 DIAGNOSIS — I10 Essential (primary) hypertension: Secondary | ICD-10-CM

## 2014-04-24 DIAGNOSIS — Z8349 Family history of other endocrine, nutritional and metabolic diseases: Secondary | ICD-10-CM

## 2014-04-24 DIAGNOSIS — Z0000 Encounter for general adult medical examination without abnormal findings: Secondary | ICD-10-CM

## 2014-04-24 LAB — THYROID PANEL WITH TSH
Free Thyroxine Index: 2.1 (ref 1.4–3.8)
T3 Uptake: 29 % (ref 22–35)
T4, Total: 7.4 ug/dL (ref 4.5–12.0)
TSH: 2.354 u[IU]/mL (ref 0.350–4.500)

## 2014-04-24 LAB — COMPLETE METABOLIC PANEL WITH GFR
ALT: 13 U/L (ref 0–35)
AST: 18 U/L (ref 0–37)
Albumin: 3.8 g/dL (ref 3.5–5.2)
Alkaline Phosphatase: 61 U/L (ref 39–117)
BILIRUBIN TOTAL: 0.6 mg/dL (ref 0.2–1.2)
BUN: 9 mg/dL (ref 6–23)
CALCIUM: 8.7 mg/dL (ref 8.4–10.5)
CHLORIDE: 108 meq/L (ref 96–112)
CO2: 26 mEq/L (ref 19–32)
Creat: 0.52 mg/dL (ref 0.50–1.10)
GFR, Est African American: >89 mL/min
Glucose, Bld: 87 mg/dL (ref 70–99)
POTASSIUM: 4.6 meq/L (ref 3.5–5.3)
Sodium: 142 mEq/L (ref 135–145)
Total Protein: 6.2 g/dL (ref 6.0–8.3)

## 2014-04-24 MED ORDER — DESLORATADINE 5 MG PO TABS: 5.0000 mg | ORAL_TABLET | Freq: Every day | ORAL | Status: DC

## 2014-04-24 NOTE — Progress Notes (Signed)
Subjective:    Patient ID: Kristin PiliJudy C Hamilton, female    DOB: 20-Dec-1940, 74 y.o.   MRN: [id number]  HPI  This 74 [facility].o. Female [facility]. She is well and takes medications for osteoporosis and essential HTN. BP readings at home: 130-140/60-80. Medications well tolerated. She c/o seasonal allergies; has not taken any med OTC. Exercises by walking dog daily and other activities.  HCM: Current MMG, CRS and IMM. Last DEXA in Oct 2015; will repeat in Oct 2017.  Patient Active Problem List   Diagnosis Date Noted  . Osteoporosis 11/29/2013  . History of wrist fracture 04/18/2013  . Seborrheic keratosis 12/03/2011  . HTN (hypertension) 08/06/2011  . Anxiety 08/06/2011  . GERD (gastroesophageal reflux disease) 08/06/2011  . History of tobacco use 08/06/2011    Prior to Admission medications   Medication Sig Start Date End Date Taking? Authorizing Provider  alendronate (FOSAMAX) 70 MG tablet Take 1 tablet (70 mg total) by mouth every 7 (seven) days. Take with a full glass of water on an empty stomach. 12/13/13  Yes Maurice MarchBarbara B Payam Gribble, MD  metoprolol tartrate (LOPRESSOR) 25 MG tablet TAKE 1 TABLET TWICE A DAY 10/10/13  Yes Maurice MarchBarbara B Jamylah Marinaccio, MD  OVER THE COUNTER MEDICATION OTC Allway eye drops for allergies prn   Yes Historical Provider, MD  Polyethyl Glycol-Propyl Glycol (SYSTANE OP) Apply 1 drop to eye 2 (two) times daily as needed.     Yes Historical Provider, MD  Ranitidine HCl (ZANTAC 75 PO) Take 1 tablet by mouth as needed.     Yes Historical Provider, MD    Past Surgical History  Procedure Laterality Date  . Wisdom tooth extraction      History   Social History  . Marital Status: Single    Spouse Name: N/A  . Number of Children: N/A  . Years of Education: N/A   Occupational History  . retired [employer]   Social History Main Topics  . Smoking status: Former Games developermoker  . Smokeless tobacco: Not on file  . Alcohol Use: No  . Drug Use: No    . Sexual Activity: Not on file   Other Topics Concern  . Not on file   Social History Narrative    Family History  Problem Relation Age of Onset  . Cancer Father 4970    pancreatic cancer  . Hypertension Father   . Breast cancer Sister   . Hypertension Sister   . Osteoporosis Sister   . Hyperlipidemia Sister   . Stroke Sister   . Ovarian cancer Sister   . Osteoporosis Sister   . Hypertension Brother   . Stroke Mother   . Heart disease Mother   . Hypertension Mother   . Heart disease Maternal Grandmother   . Stroke Maternal Grandfather   . Stroke Paternal Grandfather     Review of Systems  Constitutional: Negative.   HENT: Positive for rhinorrhea and sneezing.   Eyes: Positive for itching.  Respiratory: Negative.   Cardiovascular: Negative.   Gastrointestinal: Positive for nausea.  Endocrine: Negative.   Genitourinary: Positive for frequency.       Pt wears a pad for occasional urge incontinence.  Musculoskeletal: Negative.   Skin: Negative.   Allergic/Immunologic: Positive for environmental allergies.  Neurological: Negative.   Psychiatric/Behavioral: Negative.        Objective:   Physical Exam  Constitutional: She is oriented to person, place, and time. She appears well-developed and well-nourished. No distress.  Blood pressure 164/60, pulse 58, temperature 97.9 F (36.6 C), temperature source Oral, resp. rate 16, height  (1.473 m), weight 86 lb 3.2 oz (39.1 kg), SpO2 100 %.   HENT:  Head: Normocephalic and atraumatic.  Right Ear: Hearing, tympanic membrane, external ear and ear canal normal.  Left Ear: Hearing, tympanic membrane, external ear and ear canal normal.  Nose: Nose normal. No nasal deformity or septal deviation.  Mouth/Throat: Uvula is midline and mucous membranes are normal. No oral lesions. Normal dentition. Posterior oropharyngeal erythema present.  Eyes: Conjunctivae, EOM and lids are normal. Pupils are equal, round, and reactive to  light. No scleral icterus.  Wears corrective lenses.  Neck: Trachea normal, normal range of motion, full passive range of motion without pain and phonation normal. Neck supple. No JVD present. No spinous process tenderness and no muscular tenderness present. Carotid bruit is not present. No thyroid mass and no thyromegaly present.  Cardiovascular: Normal rate, regular rhythm, S1 normal, S2 normal, normal heart sounds, intact distal pulses and normal pulses.   No extrasystoles are present. PMI is not displaced.  Exam reveals no gallop and no friction rub.   No murmur heard. Pulmonary/Chest: Effort normal and breath sounds normal. No respiratory distress. She has no decreased breath sounds. She has no wheezes. Right breast exhibits no inverted nipple, no mass, no nipple discharge, no skin change and no tenderness. Left breast exhibits no inverted nipple, no mass, no nipple discharge, no skin change and no tenderness. Breasts are symmetrical.  Areas of dense breast tissue.  Abdominal: Soft. Normal appearance and normal aorta. She exhibits no distension, no pulsatile midline mass and no mass. There is no hepatosplenomegaly. There is no tenderness. There is no guarding and no CVA tenderness.  Genitourinary:  Deferred.  Musculoskeletal:       Cervical back: Normal.       Thoracic back: Normal.       Lumbar back: Normal.  Remainder of exam- Mild deg deformities in hands.  Lymphadenopathy:       Head (right side): No submental, no submandibular, no tonsillar, no preauricular, no posterior auricular and no occipital adenopathy present.       Head (left side): No submental, no submandibular, no tonsillar, no preauricular, no posterior auricular and no occipital adenopathy present.    She has no cervical adenopathy.    She has no axillary adenopathy.       Right: No inguinal, no supraclavicular and no epitrochlear adenopathy present.       Left: No inguinal, no supraclavicular and no epitrochlear  adenopathy present.  Neurological: She is alert and oriented to person, place, and time. She has normal strength. She displays no atrophy and no tremor. No cranial nerve deficit or sensory deficit. She exhibits normal muscle tone. She displays a negative Romberg sign. Coordination and gait normal.  Reflex Scores:      Tricep reflexes are 2+ on the right side and 2+ on the left side.      Bicep reflexes are 2+ on the right side and 2+ on the left side.      Brachioradialis reflexes are 2+ on the right side and 2+ on the left side.      Patellar reflexes are 2+ on the right side and 2+ on the left side. GET UP and GO test: time= 8 seconds.  Skin: Skin is warm, dry and intact. Lesion noted. No ecchymosis and no rash noted. She is not diaphoretic. No cyanosis or erythema.  No pallor. Nails show no clubbing.  Pt has large Seb K on R back/flank area.  Psychiatric: She has a normal mood and affect. Her speech is normal and behavior is normal. Judgment and thought content normal. Cognition and memory are normal.  Nursing note and vitals reviewed.      Assessment & Plan:  Medicare annual wellness visit, subsequent  Essential hypertension - Stable on current medication- metoprolol 25 mg 1 tab twice daily. Plan: Thyroid Panel With TSH, COMPLETE METABOLIC PANEL WITH GFR  Osteoporosis- Continue Fosamax (generic).  Family history of thyroid disease - Plan: Thyroid Panel With TSH  Skin lesion of back - Plan: Ambulatory referral to Dermatology   Meds ordered this encounter  Medications  . desloratadine (CLARINEX) 5 MG tablet    Sig: Take 1 tablet (5 mg total) by mouth daily.    Dispense:  30 tablet    Refill:  5  Pt will try this medication for seasonal allergies.

## 2014-04-24 NOTE — Patient Instructions (Signed)

## 2014-04-25 NOTE — Progress Notes (Signed)
Quick Note:  Please notify pt that results are normal.   Provide pt with copy of labs. ______ 

## 2014-10-23 ENCOUNTER — Ambulatory Visit (INDEPENDENT_AMBULATORY_CARE_PROVIDER_SITE_OTHER): Payer: Medicare Other | Admitting: Family Medicine

## 2014-10-23 ENCOUNTER — Encounter: Payer: Self-pay | Admitting: Family Medicine

## 2014-10-23 VITALS — BP 157/72 | HR 56 | Temp 97.8°F | Resp 16 | Wt 86.8 lb

## 2014-10-23 DIAGNOSIS — Z23 Encounter for immunization: Secondary | ICD-10-CM

## 2014-10-23 DIAGNOSIS — I10 Essential (primary) hypertension: Secondary | ICD-10-CM

## 2014-10-23 MED ORDER — LISINOPRIL 10 MG PO TABS: 10.0000 mg | ORAL_TABLET | Freq: Every day | ORAL | Status: DC

## 2014-10-23 NOTE — Patient Instructions (Signed)
Take 1/2 tablet metoprolol twice a day for a week, then stop metoprolol and start lisinopril.  Monitor your blood pressure and keep a record.  If it it over 160 on the top number or over 95 on the bottom number, please call me and leave me a message.

## 2014-10-23 NOTE — Progress Notes (Signed)
Subjective:    Patient ID: Kristin Hamilton, female    DOB: 06-14-1940, 74 y.o.   MRN: [id number]  HPI This is a very pleasant 74 yo female who presents today for follow up of HTN, osteoporosis. She walks for exercise. Has occasional constipation. Eats more fiber with good results.   Home blood pressure readings over last 2 months have ranged from 133-148/59-78.  She has been a little down lately because she and her sister had to put down their dog of 15 years. Her mood was good prior to this and she anticipates it will return to baseline.   Past Medical History  Diagnosis Date  . Hypertension   . Anemia   . Constipation   . Cataract   . Osteoporosis   . Arthritis   . GERD (gastroesophageal reflux disease)    Past Surgical History  Procedure Laterality Date  . Wisdom tooth extraction     Family History  Problem Relation Age of Onset  . Cancer Father 14    pancreatic cancer  . Hypertension Father   . Breast cancer Sister   . Hypertension Sister   . Osteoporosis Sister   . Hyperlipidemia Sister   . Stroke Sister   . Ovarian cancer Sister   . Osteoporosis Sister   . Hypertension Brother   . Stroke Mother   . Heart disease Mother   . Hypertension Mother   . Heart disease Maternal Grandmother   . Stroke Maternal Grandfather   . Stroke Paternal Grandfather    Social History  Substance Use Topics  . Smoking status: Former Games developer  . Smokeless tobacco: None  . Alcohol Use: No     Review of Systems No chest pain, no SOB, no falls, no edema    Objective:   Physical Exam Physical Exam  Constitutional: Oriented to person, place, and time. She appears well-developed and well-nourished. She is thin.  HENT:  Head: Normocephalic and atraumatic.  Eyes: Conjunctivae are normal.  Neck: Normal range of motion. Neck supple.  Cardiovascular: Normal rate, regular rhythm and normal heart sounds.   Pulmonary/Chest: Effort normal and breath sounds normal.  Musculoskeletal:  Normal range of motion.  Neurological: Alert and oriented to person, place, and time.  Skin: Skin is warm and dry.  Psychiatric: Normal mood and affect. Behavior is normal. Judgment and thought content normal.  Vitals reviewed. BP 157/72 mmHg  Pulse 56  Temp(Src) 97.8 F (36.6 C) (Oral)  Resp 16  Wt 86 lb 12.8 oz (39.372 kg) Wt Readings from Last 3 Encounters:  10/23/14 86 lb 12.8 oz (39.372 kg)  04/24/14 86 lb 3.2 oz (39.1 kg)  12/13/13 86 lb (39.009 kg)   Depression screen [facility] 2/9 10/23/2014 04/24/2014 12/13/2013 11/29/2013 10/10/2013  Decreased Interest 1 0 0 0 0  Down, Depressed, Hopeless 1 0 0 0 0  PHQ - 2 Score 2 0 0 0 0  Altered sleeping 1 - - - -  Tired, decreased energy 1 - - - -  Change in appetite 0 - - - -  Feeling bad or failure about yourself  1 - - - -  Trouble concentrating 1 - - - -  Moving slowly or fidgety/restless 0 - - - -  Suicidal thoughts 0 - - - -  PHQ-9 Score 6 - - - -      Assessment & Plan:  1. Need for prophylactic vaccination and inoculation against influenza - Flu Vaccine QUAD 36+ mos IM  2. Essential hypertension -  Her HR is 56 and she complains of being tired. Will wean her metoprolol over the next week and then start lisinopril - discussed potential side effects - patient will check her blood pressure at home and keep a log. She was provided parameters to call - lisinopril (PRINIVIL,ZESTRIL) 10 MG tablet; Take 1 tablet (10 mg total) by mouth daily.  Dispense: 30 tablet; Refill: 4  - follow up in 1 month, will check BMET at that time Olean Ree, FNP-BC  [facility], [facility]  10/25/2014 8:32 AM

## 2014-10-25 ENCOUNTER — Other Ambulatory Visit: Payer: Self-pay

## 2014-10-25 MED ORDER — METOPROLOL TARTRATE 25 MG PO TABS: ORAL_TABLET | ORAL | Status: DC

## 2014-11-20 ENCOUNTER — Ambulatory Visit (INDEPENDENT_AMBULATORY_CARE_PROVIDER_SITE_OTHER): Payer: Medicare Other | Admitting: Family Medicine

## 2014-11-20 ENCOUNTER — Encounter: Payer: Self-pay | Admitting: Family Medicine

## 2014-11-20 VITALS — BP 150/72 | HR 75 | Temp 97.6°F | Resp 16 | Wt 86.4 lb

## 2014-11-20 DIAGNOSIS — I10 Essential (primary) hypertension: Secondary | ICD-10-CM

## 2014-11-20 NOTE — Progress Notes (Signed)
   Subjective:    Patient ID: Kristin Hamilton, female    DOB: 09/13/1940, 74 y.o.   MRN: [id number]  HPI This is a pleasant 74 yo female who presents today for follow up HTN. She weaned off her metoprolol and thinks she feels better. She had one episode of dizziness yesterday when gardening in the heat. She sat down and it passed in a few minutes. She did not have any loss of consciousness, no chest pain or SOB.   Her blood pressure readings at home have ranged from 132-147/68-76.   She thinks her blood pressure is a little higher today because her low tire pressure light came on while she was driving here today.  Review of Systems No chest pain or SOB.     Objective:   Physical Exam Physical Exam  Vitals reviewed. Constitutional: Oriented to person, place, and time. Appears well-developed and well-nourished. Thin.  HENT:  Head: Normocephalic and atraumatic.  Eyes: Conjunctivae are normal.  Neck: Normal range of motion. Neck supple.  Cardiovascular: Normal rate.   Pulmonary/Chest: Effort normal.  Musculoskeletal: Normal range of motion.  Neurological: Alert and oriented to person, place, and time.  Skin: Skin is warm and dry.  Psychiatric: Normal mood and affect. Behavior is normal. Judgment and thought content normal.  Recheck BP- 150/72 BP 169/70 mmHg  Pulse 75  Temp(Src) 97.6 F (36.4 C) (Oral)  Resp 16  Wt 86 lb 6.4 oz (39.191 kg) Wt Readings from Last 3 Encounters:  11/20/14 86 lb 6.4 oz (39.191 kg)  10/23/14 86 lb 12.8 oz (39.372 kg)  04/24/14 86 lb 3.2 oz (39.1 kg)   [facility] 2/9 11/20/2014 10/23/2014 04/24/2014 12/13/2013 11/29/2013  Decreased Interest 0 1 0 0 0  Down, Depressed, Hopeless 0 1 0 0 0  PHQ - 2 Score 0 2 0 0 0  Altered sleeping - 1 - - -  Tired, decreased energy - 1 - - -  Change in appetite - 0 - - -  Feeling bad or failure about yourself  - 1 - - -  Trouble concentrating - 1 - - -  Moving slowly or fidgety/restless - 0 - - -  Suicidal  thoughts - 0 - - -  PHQ-9 Score - 6 - - -      Assessment & Plan:  1. Essential hypertension - home blood pressure readings look perfect, will continue lisinopril 10 mg - follow up in 6 months, sooner if any problems.    Olean Reeeborah Gessner, FNP-BC  [facility], [facility]  11/20/2014 11:21 AM

## 2014-11-21 ENCOUNTER — Ambulatory Visit: Payer: Medicare Other | Admitting: Family Medicine

## 2014-11-21 ENCOUNTER — Other Ambulatory Visit: Payer: Self-pay

## 2014-11-21 MED ORDER — ALENDRONATE SODIUM 70 MG PO TABS: 70.0000 mg | ORAL_TABLET | ORAL | Status: DC

## 2014-11-21 NOTE — Telephone Encounter (Signed)
Kristin Hamilton, you just saw pt for check up, but this med not discussed. OK to RF?

## 2015-02-15 ENCOUNTER — Encounter: Payer: Self-pay | Admitting: Family Medicine

## 2015-02-20 ENCOUNTER — Encounter: Payer: Self-pay | Admitting: Family Medicine

## 2015-03-10 ENCOUNTER — Other Ambulatory Visit: Payer: Self-pay | Admitting: Family Medicine

## 2015-05-01 ENCOUNTER — Telehealth: Payer: Self-pay

## 2015-05-01 NOTE — Telephone Encounter (Signed)
Left message for patient to return call to update chart information.

## 2015-05-02 ENCOUNTER — Encounter: Payer: Self-pay | Admitting: Family Medicine

## 2015-05-02 ENCOUNTER — Ambulatory Visit (INDEPENDENT_AMBULATORY_CARE_PROVIDER_SITE_OTHER): Payer: Medicare Other | Admitting: Family Medicine

## 2015-05-02 VITALS — BP 128/76 | HR 78 | Temp 97.7°F | Ht <= 58 in | Wt 87.8 lb

## 2015-05-02 DIAGNOSIS — Z23 Encounter for immunization: Secondary | ICD-10-CM | POA: Diagnosis not present

## 2015-05-02 DIAGNOSIS — Z13220 Encounter for screening for lipoid disorders: Secondary | ICD-10-CM | POA: Diagnosis not present

## 2015-05-02 DIAGNOSIS — I10 Essential (primary) hypertension: Secondary | ICD-10-CM | POA: Diagnosis not present

## 2015-05-02 DIAGNOSIS — Z5181 Encounter for therapeutic drug level monitoring: Secondary | ICD-10-CM | POA: Diagnosis not present

## 2015-05-02 DIAGNOSIS — Z1239 Encounter for other screening for malignant neoplasm of breast: Secondary | ICD-10-CM

## 2015-05-02 MED ORDER — LISINOPRIL 10 MG PO TABS: 10.0000 mg | ORAL_TABLET | Freq: Every day | ORAL | Status: DC

## 2015-05-02 NOTE — Progress Notes (Addendum)
[facility] [address], [address] [place], [place] [address] [phone] [phone]  Date:  05/02/2015   Name:  Kristin Hamilton   DOB:  [birth year]   MRN:  [id number]  PCP:  Abbe AmsterdamOPLAND,Cohan Stipes, MD    Chief Complaint: New Patient (Initial Visit)   History of Present Illness:  Kristin Hamilton is a 75 y.o. very pleasant female patient who presents with the following:  History of HTN, GERD, osteoporosis, anxiety. Here today to establish care  dexa scan 2015 She is on fosamx, lisinopril 10, zantac  She has had pnumomax in 2013, zostavax is UTD.  Tetanus is UTD I have seen this pt many years ago.   She has not noted any SE of lisinopril.  No SOB, no CP, occasional sporadic HA.   Started fosamax after her dexa in 2015.   She brings in a sheet of home BP that are all under 142/90.  Appears taht she is well controlled  She is on clarinex- wonders if generic loratadine would be ok as it would be cheaper for her- let her know that this is certainly ok  She ate about 2 hours ago BP Readings from Last 3 Encounters:  05/02/15 128/76  11/20/14 150/72  10/23/14 157/72     Patient Active Problem List   Diagnosis Date Noted  . Osteoporosis 11/29/2013  . History of wrist fracture 04/18/2013  . Seborrheic keratosis 12/03/2011  . HTN (hypertension) 08/06/2011  . Anxiety 08/06/2011  . GERD (gastroesophageal reflux disease) 08/06/2011  . History of tobacco use 08/06/2011    Past Medical History  Diagnosis Date  . Hypertension   . Anemia   . Constipation   . Cataract   . Osteoporosis   . Arthritis   . GERD (gastroesophageal reflux disease)     Past Surgical History  Procedure Laterality Date  . Wisdom tooth extraction      Social History  Substance Use Topics  . Smoking status: Former Games developermoker  . Smokeless tobacco: None  . Alcohol Use: No    Family History  Problem Relation Age of Onset  . Cancer Father 3970    pancreatic cancer  .  Hypertension Father   . Breast cancer Sister   . Hypertension Sister   . Osteoporosis Sister   . Hyperlipidemia Sister   . Stroke Sister   . Ovarian cancer Sister   . Osteoporosis Sister   . Hypertension Brother   . Stroke Mother   . Heart disease Mother   . Hypertension Mother   . Heart disease Maternal Grandmother   . Stroke Maternal Grandfather   . Stroke Paternal Grandfather     Allergies  Allergen Reactions  . Codeine Nausea Only    Medication list has been reviewed and updated.  Current Outpatient Prescriptions on File Prior to Visit  Medication Sig Dispense Refill  . alendronate (FOSAMAX) 70 MG tablet Take 1 tablet (70 mg total) by mouth every 7 (seven) days. Take with a full glass of water on an empty stomach. 12 tablet 1  . desloratadine (CLARINEX) 5 MG tablet Take 1 tablet (5 mg total) by mouth daily. 30 tablet 5  . lisinopril (PRINIVIL,ZESTRIL) 10 MG tablet TAKE 1 TABLET EVERY DAY 30 tablet 1  . OVER THE COUNTER MEDICATION OTC Allway eye drops for allergies prn    . Polyethyl Glycol-Propyl Glycol (SYSTANE OP) Apply 1 drop to eye 2 (two) times daily as needed.      .Marland Kitchen  Ranitidine HCl (ZANTAC 75 PO) Take 1 tablet by mouth as needed.       No current facility-administered medications on file prior to visit.    Review of Systems:  As per HPI- otherwise negative.   Physical Examination: Filed Vitals:   05/02/15 1330  BP: 128/76  Pulse: 78  Temp: 97.7 F (36.5 C)   Filed Vitals:   05/02/15 1330  Height:  (1.473 m)  Weight: 87 lb 12.8 oz (39.826 kg)   Body mass index is 18.36 kg/(m^2). Ideal Body Weight: Weight in (lb) to have BMI = 25: 119.4  GEN: WDWN, NAD, Non-toxic, A & O x 3, slight build, looks well HEENT: Atraumatic, Normocephalic. Neck supple. No masses, No LAD.   Ears and Nose: No external deformity. CV: RRR, No M/G/R. No JVD. No thrill. No extra heart sounds. PULM: CTA B, no wheezes, crackles, rhonchi. No retractions. No resp. distress.  No accessory muscle use.Marland Kitchen EXTR: No c/c/e NEURO Normal gait.  PSYCH: Normally interactive. Conversant. Not depressed or anxious appearing.  Calm demeanor.    Assessment and Plan: Benign essential HTN - Plan: lisinopril (PRINIVIL,ZESTRIL) 10 MG tablet, Lipid panel  Immunization due - Plan: Pneumococcal conjugate vaccine 13-valent IM  Screening for hyperlipidemia - Plan: Lipid panel  Medication monitoring encounter - Plan: CBC, Comprehensive metabolic panel  Screening for breast cancer  Unable to sign mammogram order as medicare will not allow.  Pt will call and schedule and will let me know if any problems.  Her BP is well controlled Continue fosamax, plan for repeat bone density 2 years after last Will plan further follow- up pending labs.   Signed Abbe Amsterdam, MD  Called her to go over labs 4/13/.  I am concerned that she is a bit anemia. Need to r/o GI bleeding.  We will get her some stool tests to do at home   Results for orders placed or performed in visit on 05/02/15  CBC  Result Value Ref Range   WBC 4.8 4.0 - 10.5 K/uL   RBC 3.76 (L) 3.87 - 5.11 Mil/uL   Platelets 276.0 150.0 - 400.0 K/uL   Hemoglobin 11.2 (L) 12.0 - 15.0 g/dL   HCT 91.4 (L) 78.2 - 95.6 %   MCV 89.3 78.0 - 100.0 fl   MCHC 33.4 30.0 - 36.0 g/dL   RDW 21.3 08.6 - 57.8 %  Comprehensive metabolic panel  Result Value Ref Range   Sodium 138 135 - 145 mEq/L   Potassium 4.4 3.5 - 5.1 mEq/L   Chloride 105 96 - 112 mEq/L   CO2 29 19 - 32 mEq/L   Glucose, Bld 92 70 - 99 mg/dL   BUN 11 6 - 23 mg/dL   Creatinine, Ser 4.69 0.40 - 1.20 mg/dL   Total Bilirubin 0.3 0.2 - 1.2 mg/dL   Alkaline Phosphatase 61 39 - 117 U/L   AST 18 0 - 37 U/L   ALT 14 0 - 35 U/L   Total Protein 6.5 6.0 - 8.3 g/dL   Albumin 4.0 3.5 - 5.2 g/dL   Calcium 8.9 8.4 - 62.9 mg/dL   GFR 528.41 >32.44 mL/min  Lipid panel  Result Value Ref Range   Cholesterol 200 0 - 200 mg/dL   Triglycerides 01.0 0.0 - 149.0 mg/dL   HDL 27.25  >36.64 mg/dL   VLDL 40.3 0.0 - 47.4 mg/dL   LDL Cholesterol 259 (H) 0 - 99 mg/dL   Total CHOL/HDL Ratio 3    NonHDL  128.47     

## 2015-05-02 NOTE — Progress Notes (Signed)
Pre visit review using our clinic review tool, if applicable. No additional management support is needed unless otherwise documented below in the visit note. 

## 2015-05-02 NOTE — Patient Instructions (Addendum)
It was nice to meet you today!   You got your prevnar 13 pneumonia booster today I will be in touch with your labs asap  Please see me in 6 months We will set up a mammogram  Please call the [facility] imaging center at [phone] to schedule a mammogram

## 2015-05-03 LAB — COMPREHENSIVE METABOLIC PANEL
ALBUMIN: 4 g/dL (ref 3.5–5.2)
ALT: 14 U/L (ref 0–35)
AST: 18 U/L (ref 0–37)
Alkaline Phosphatase: 61 U/L (ref 39–117)
BUN: 11 mg/dL (ref 6–23)
CALCIUM: 8.9 mg/dL (ref 8.4–10.5)
CHLORIDE: 105 meq/L (ref 96–112)
CO2: 29 meq/L (ref 19–32)
Creatinine, Ser: 0.56 mg/dL (ref 0.40–1.20)
GFR: 112.39 mL/min (ref >60.00)
Glucose, Bld: 92 mg/dL (ref 70–99)
POTASSIUM: 4.4 meq/L (ref 3.5–5.1)
Sodium: 138 mEq/L (ref 135–145)
Total Bilirubin: 0.3 mg/dL (ref 0.2–1.2)
Total Protein: 6.5 g/dL (ref 6.0–8.3)

## 2015-05-03 LAB — LIPID PANEL
CHOL/HDL RATIO: 3
CHOLESTEROL: 200 mg/dL (ref 0–200)
HDL: 71.2 mg/dL (ref >39.00)
LDL CALC: 114 mg/dL — AB (ref 0–99)
NonHDL: 128.47
TRIGLYCERIDES: 73 mg/dL (ref 0.0–149.0)
VLDL: 14.6 mg/dL (ref 0.0–40.0)

## 2015-05-03 LAB — CBC
HCT: 33.5 % — ABNORMAL LOW (ref 36.0–46.0)
HEMOGLOBIN: 11.2 g/dL — AB (ref 12.0–15.0)
MCHC: 33.4 g/dL (ref 30.0–36.0)
MCV: 89.3 fl (ref 78.0–100.0)
PLATELETS: 276 10*3/uL (ref 150.0–400.0)
RBC: 3.76 Mil/uL — AB (ref 3.87–5.11)
RDW: 13.2 % (ref 11.5–15.5)
WBC: 4.8 10*3/uL (ref 4.0–10.5)

## 2015-05-07 NOTE — Telephone Encounter (Signed)
Pre visit call completed 

## 2015-05-08 ENCOUNTER — Other Ambulatory Visit: Payer: Self-pay | Admitting: Family Medicine

## 2015-05-08 DIAGNOSIS — Z1231 Encounter for screening mammogram for malignant neoplasm of breast: Secondary | ICD-10-CM

## 2015-05-09 ENCOUNTER — Ambulatory Visit (HOSPITAL_BASED_OUTPATIENT_CLINIC_OR_DEPARTMENT_OTHER)
Admission: RE | Admit: 2015-05-09 | Discharge: 2015-05-09 | Disposition: A | Discharge status: Home / Self Care | Payer: Medicare Other | Source: Ambulatory Visit | Attending: Family Medicine | Admitting: Family Medicine

## 2015-05-09 ENCOUNTER — Encounter (INDEPENDENT_AMBULATORY_CARE_PROVIDER_SITE_OTHER): Payer: Self-pay

## 2015-05-09 ENCOUNTER — Encounter: Payer: Self-pay | Admitting: Family Medicine

## 2015-05-09 DIAGNOSIS — Z1231 Encounter for screening mammogram for malignant neoplasm of breast: Secondary | ICD-10-CM | POA: Insufficient documentation

## 2015-05-09 DIAGNOSIS — R928 Other abnormal and inconclusive findings on diagnostic imaging of breast: Secondary | ICD-10-CM | POA: Diagnosis not present

## 2015-05-13 ENCOUNTER — Other Ambulatory Visit: Payer: Self-pay | Admitting: Family Medicine

## 2015-05-13 DIAGNOSIS — R928 Other abnormal and inconclusive findings on diagnostic imaging of breast: Secondary | ICD-10-CM

## 2015-05-20 ENCOUNTER — Ambulatory Visit
Admission: RE | Admit: 2015-05-20 | Discharge: 2015-05-20 | Disposition: A | Discharge status: Home / Self Care | Payer: Medicare Other | Source: Ambulatory Visit | Attending: Family Medicine | Admitting: Family Medicine

## 2015-05-20 DIAGNOSIS — R928 Other abnormal and inconclusive findings on diagnostic imaging of breast: Secondary | ICD-10-CM

## 2015-05-21 ENCOUNTER — Ambulatory Visit: Payer: Medicare Other | Admitting: Family Medicine

## 2015-05-30 ENCOUNTER — Other Ambulatory Visit: Payer: Medicare Other

## 2015-05-31 ENCOUNTER — Other Ambulatory Visit: Payer: Medicare Other

## 2015-06-03 ENCOUNTER — Other Ambulatory Visit (INDEPENDENT_AMBULATORY_CARE_PROVIDER_SITE_OTHER): Payer: Medicare Other

## 2015-06-03 DIAGNOSIS — Z1239 Encounter for other screening for malignant neoplasm of breast: Secondary | ICD-10-CM

## 2015-06-03 DIAGNOSIS — Z23 Encounter for immunization: Secondary | ICD-10-CM

## 2015-06-03 DIAGNOSIS — Z13220 Encounter for screening for lipoid disorders: Secondary | ICD-10-CM

## 2015-06-03 DIAGNOSIS — I10 Essential (primary) hypertension: Secondary | ICD-10-CM

## 2015-06-03 DIAGNOSIS — Z5181 Encounter for therapeutic drug level monitoring: Secondary | ICD-10-CM

## 2015-06-03 DIAGNOSIS — R799 Abnormal finding of blood chemistry, unspecified: Secondary | ICD-10-CM

## 2015-06-03 LAB — FECAL OCCULT BLOOD, IMMUNOCHEMICAL: FECAL OCCULT BLD: NEGATIVE

## 2015-06-03 NOTE — Addendum Note (Signed)
Addended by: Eustace QuailEABOLD, Cohen Boettner J on: 06/03/2015 03:00 PM   Modules accepted: Orders

## 2015-06-04 ENCOUNTER — Encounter: Payer: Self-pay | Admitting: Family Medicine

## 2015-06-21 ENCOUNTER — Other Ambulatory Visit: Payer: Self-pay | Admitting: Family Medicine

## 2015-06-26 ENCOUNTER — Other Ambulatory Visit: Payer: Self-pay | Admitting: Family Medicine

## 2015-11-13 ENCOUNTER — Encounter: Payer: Self-pay | Admitting: Family Medicine

## 2015-11-13 ENCOUNTER — Ambulatory Visit (INDEPENDENT_AMBULATORY_CARE_PROVIDER_SITE_OTHER): Payer: Medicare Other | Admitting: Family Medicine

## 2015-11-13 VITALS — BP 160/80 | HR 64 | Temp 97.9°F | Ht <= 58 in | Wt 85.4 lb

## 2015-11-13 DIAGNOSIS — R636 Underweight: Secondary | ICD-10-CM | POA: Diagnosis not present

## 2015-11-13 DIAGNOSIS — R634 Abnormal weight loss: Secondary | ICD-10-CM | POA: Diagnosis not present

## 2015-11-13 DIAGNOSIS — M810 Age-related osteoporosis without current pathological fracture: Secondary | ICD-10-CM | POA: Diagnosis not present

## 2015-11-13 DIAGNOSIS — Z23 Encounter for immunization: Secondary | ICD-10-CM | POA: Diagnosis not present

## 2015-11-13 DIAGNOSIS — D649 Anemia, unspecified: Secondary | ICD-10-CM

## 2015-11-13 DIAGNOSIS — I10 Essential (primary) hypertension: Secondary | ICD-10-CM

## 2015-11-13 LAB — CBC
HCT: 33.1 % — ABNORMAL LOW (ref 36.0–46.0)
Hemoglobin: 11.3 g/dL — ABNORMAL LOW (ref 12.0–15.0)
MCHC: 34.1 g/dL (ref 30.0–36.0)
MCV: 88.2 fl (ref 78.0–100.0)
Platelets: 296 10*3/uL (ref 150.0–400.0)
RBC: 3.75 Mil/uL — AB (ref 3.87–5.11)
RDW: 13.6 % (ref 11.5–15.5)
WBC: 4.2 10*3/uL (ref 4.0–10.5)

## 2015-11-13 LAB — BASIC METABOLIC PANEL
BUN: 9 mg/dL (ref 6–23)
CHLORIDE: 106 meq/L (ref 96–112)
CO2: 27 mEq/L (ref 19–32)
Calcium: 8.6 mg/dL (ref 8.4–10.5)
Creatinine, Ser: 0.55 mg/dL (ref 0.40–1.20)
GFR: 114.59 mL/min (ref >60.00)
Glucose, Bld: 119 mg/dL — ABNORMAL HIGH (ref 70–99)
POTASSIUM: 4 meq/L (ref 3.5–5.1)
SODIUM: 139 meq/L (ref 135–145)

## 2015-11-13 LAB — TSH: TSH: 1.55 u[IU]/mL (ref 0.35–4.50)

## 2015-11-13 NOTE — Progress Notes (Signed)
Pre visit review using our clinic review tool, if applicable. No additional management support is needed unless otherwise documented below in the visit note. 

## 2015-11-13 NOTE — Patient Instructions (Signed)
It was great to see you today- we will check some basic labs and I will set you up for a repeat bone density exam.   Since you are getting more exercise make sure you are eating more!  I would like to see you gain at least 5 lbs  Please come and see me in about 6 months and enjoy your new dog!

## 2015-11-13 NOTE — Progress Notes (Signed)
[facility] [address], [address] [place], [place] [phone] [phone]  Date:  11/13/2015   Name:  Kristin PiliJudy C Hamilton   DOB:  01/06/1941   MRN:  [id number]  PCP:  Abbe AmsterdamOPLAND,JESSICA, MD    Chief Complaint: Follow-up (Pt here for f/u visit. Pt has log of home bp readings. Would like to get flu vaccine today. )   History of Present Illness:  Kristin PiliJudy C Hamilton is a 75 y.o. very pleasant female patient who presents with the following:  Here today to follow-up HTN.  She brings a list of home BP readings- they are all quite good, average 125- 130/ 60s. Pulse in the 60s.   BP Readings from Last 3 Encounters:  11/13/15 (!) 175/80  05/02/15 128/76  11/20/14 (!) 150/72   Her BP is sometimes elevated in the office Last labs in April- cholesterol overall favorable, normal renal function  She is still taking lisinopril 10 mg a day  She has been on fosamax for about 2 years- last dexa was 2 years ago.   She feels like her energy level and appetite is good.  Her family is doing well.  She has not noted any sx of lightheadedness, no pre-syncope or syncope Flu shot today She recently got a new dog from a rescue- has been getting plenty of exercise taking him for walks She has always been thin and struggles to maintain weight  Wt Readings from Last 3 Encounters:  11/13/15 85 lb 6.4 oz (38.7 kg)  05/02/15 87 lb 12.8 oz (39.8 kg)  11/20/14 86 lb 6.4 oz (39.2 kg)     Patient Active Problem List   Diagnosis Date Noted  . Osteoporosis 11/29/2013  . History of wrist fracture 04/18/2013  . Seborrheic keratosis 12/03/2011  . HTN (hypertension) 08/06/2011  . Anxiety 08/06/2011  . GERD (gastroesophageal reflux disease) 08/06/2011  . History of tobacco use 08/06/2011    Past Medical History:  Diagnosis Date  . Anemia   . Arthritis   . Cataract   . Chicken pox   . Constipation   . Depression   . GERD (gastroesophageal reflux disease)   .  Hay fever   . Hypertension   . Osteoporosis     Past Surgical History:  Procedure Laterality Date  . WISDOM TOOTH EXTRACTION      Social History  Substance Use Topics  . Smoking status: Former Games developermoker  . Smokeless tobacco: Not on file  . Alcohol use No    Family History  Problem Relation Age of Onset  . Cancer Father 3970    pancreatic cancer  . Hypertension Father   . Breast cancer Sister   . Hypertension Sister   . Osteoporosis Sister   . Hyperlipidemia Sister   . Stroke Sister   . Ovarian cancer Sister   . Osteoporosis Sister   . Hypertension Brother   . Stroke Mother   . Heart disease Mother   . Hypertension Mother   . Heart disease Maternal Grandmother   . Stroke Maternal Grandfather   . Stroke Paternal Grandfather     Allergies  Allergen Reactions  . Codeine Nausea Only    Medication list has been reviewed and updated.  Current Outpatient Prescriptions on File Prior to Visit  Medication Sig Dispense Refill  . alendronate (FOSAMAX) 70 MG tablet TAKE 1 TABLET BY MOUTH EVERY 7 DAYS. TAKE WITH A FULL GLASS OF WATER ON AN EMPTY STOMACH  12 tablet 1  . lisinopril (PRINIVIL,ZESTRIL) 10 MG tablet Take 1 tablet (10 mg total) by mouth daily. 90 tablet 3  . OVER THE COUNTER MEDICATION OTC Allway eye drops for allergies prn    . Polyethyl Glycol-Propyl Glycol (SYSTANE OP) Apply 1 drop to eye 2 (two) times daily as needed.      . Ranitidine HCl (ZANTAC 75 PO) Take 1 tablet by mouth as needed.       No current facility-administered medications on file prior to visit.     Review of Systems:  As per HPI- otherwise negative. No fever,chills, CP, SOB, nausea or vomiting  Physical Examination: Vitals:   11/13/15 1111 11/13/15 1114  BP: (!) 169/71 (!) 175/80  Pulse: 64   Temp: 97.9 F (36.6 C)    Vitals:   11/13/15 1111  Weight: 85 lb 6.4 oz (38.7 kg)  Height: 4\' 10"  (1.473 m)   Body mass index is 17.85 kg/m. Ideal Body Weight: Weight in (lb) to have BMI =  25: 119.4  GEN: WDWN, NAD, Non-toxic, A & O x 3, underweight, ow looks well HEENT: Atraumatic, Normocephalic. Neck supple. No masses, No LAD.  Bilateral TM wnl, oropharynx normal.  PEERL,EOMI.   Ears and Nose: No external deformity. CV: RRR, No M/G/R. No JVD. No thrill. No extra heart sounds. PULM: CTA B, no wheezes, crackles, rhonchi. No retractions. No resp. distress. No accessory muscle use. ABD: S, NT, ND EXTR: No c/c/e NEURO Normal gait.  PSYCH: Normally interactive. Conversant. Not depressed or anxious appearing.  Calm demeanor.    Assessment and Plan: Benign essential HTN - Plan: Basic metabolic panel  Age-related osteoporosis without current pathological fracture - Plan: TSH, DG Bone Density  Underweight - Plan: TSH  Mild anemia - Plan: CBC  Loss of weight - Plan: TSH  Encouraged her to gain weight, will check a repeat Dexa since she has started fosamax Her BP is generally fine at home, continue current medications Labs pending as above- mild anemia at last lab draw, will followup today  Signed Abbe Amsterdam, MD

## 2015-11-15 ENCOUNTER — Encounter: Payer: Self-pay | Admitting: Family Medicine

## 2015-11-15 ENCOUNTER — Encounter: Payer: Self-pay | Admitting: Internal Medicine

## 2015-11-15 NOTE — Addendum Note (Signed)
Addended by: Abbe AmsterdamOPLAND, JESSICA C on: 11/15/2015 06:19 AM   Modules accepted: Orders

## 2015-11-27 ENCOUNTER — Ambulatory Visit
Admission: RE | Admit: 2015-11-27 | Discharge: 2015-11-27 | Disposition: A | Discharge status: Home / Self Care | Payer: Medicare Other | Source: Ambulatory Visit | Attending: Family Medicine | Admitting: Family Medicine

## 2015-11-27 ENCOUNTER — Encounter: Payer: Self-pay | Admitting: Family Medicine

## 2015-11-27 DIAGNOSIS — M810 Age-related osteoporosis without current pathological fracture: Secondary | ICD-10-CM

## 2015-12-06 ENCOUNTER — Other Ambulatory Visit: Payer: Self-pay | Admitting: Family Medicine

## 2015-12-06 ENCOUNTER — Other Ambulatory Visit: Payer: Self-pay | Admitting: Emergency Medicine

## 2015-12-06 MED ORDER — ALENDRONATE SODIUM 70 MG PO TABS: ORAL_TABLET | ORAL | 1 refills | Status: DC

## 2016-01-16 ENCOUNTER — Ambulatory Visit (INDEPENDENT_AMBULATORY_CARE_PROVIDER_SITE_OTHER): Payer: Medicare Other | Admitting: Internal Medicine

## 2016-01-16 ENCOUNTER — Encounter (INDEPENDENT_AMBULATORY_CARE_PROVIDER_SITE_OTHER): Payer: Self-pay

## 2016-01-16 ENCOUNTER — Encounter: Payer: Self-pay | Admitting: Internal Medicine

## 2016-01-16 ENCOUNTER — Other Ambulatory Visit (INDEPENDENT_AMBULATORY_CARE_PROVIDER_SITE_OTHER): Payer: Medicare Other

## 2016-01-16 VITALS — BP 158/70 | HR 68 | Ht <= 58 in | Wt 86.2 lb

## 2016-01-16 DIAGNOSIS — K5520 Angiodysplasia of colon without hemorrhage: Secondary | ICD-10-CM | POA: Diagnosis not present

## 2016-01-16 DIAGNOSIS — D649 Anemia, unspecified: Secondary | ICD-10-CM

## 2016-01-16 LAB — FERRITIN: Ferritin: 19.7 ng/mL (ref 10.0–291.0)

## 2016-01-16 LAB — VITAMIN B12: VITAMIN B 12: 234 pg/mL (ref 211–911)

## 2016-01-16 LAB — FOLATE: FOLATE: 18 ng/mL (ref >5.9)

## 2016-01-16 LAB — IBC PANEL
IRON: 113 ug/dL (ref 42–145)
SATURATION RATIOS: 32 % (ref 20.0–50.0)
TRANSFERRIN: 252 mg/dL (ref 212.0–360.0)

## 2016-01-16 NOTE — Progress Notes (Signed)
HISTORY OF PRESENT ILLNESS:  Kristin Hamilton is a 75 y.o. female who is referred to the courtesy of Dr. Patsy Lageropland with a chief complaint of anemia. Patient was last seen by myself 06/06/2010 for routine screening colonoscopy. Complete colonoscopy with excellent preparation was normal except for an incidental AV malformation of the cecum. Routine follow-up in 10 years recommended. Patient is sent today regarding anemia. GI review of systems is negative except for occasional indigestion for which she takes as needed Zantac. She denies dysphagia, abdominal pain, weight loss, change in bowel habits, melena, or hematochezia. Review of blood work from October 2017 finds a hemoglobin of 11.3. Normal MCV of 88.2. Other blood lines normal. Hemoglobin from April 2017 was 11.2, March 2015 was 11.6, and December 2011 was 11.6. Previous Hemoccult studies in 2015 were negative for occult blood.  REVIEW OF SYSTEMS:  All non-GI ROS negative except for sinus and allergy, cough, depression  Past Medical History:  Diagnosis Date  . Anemia   . Arthritis   . Cataract   . Chicken pox   . Constipation   . Depression   . GERD (gastroesophageal reflux disease)   . Hay fever   . Hypertension   . Osteoporosis     Past Surgical History:  Procedure Laterality Date  . WISDOM TOOTH EXTRACTION      Social History Kristin PiliJudy C Brigit Hamilton  reports that she has quit smoking. She has never used smokeless tobacco. She reports that she does not drink alcohol or use drugs.  family history includes Breast cancer in her sister; Cancer (age of onset: 5370) in her father; Heart disease in her maternal grandmother and mother; Hyperlipidemia in her sister; Hypertension in her brother, father, mother, and sister; Osteoporosis in her sister and sister; Ovarian cancer in her sister; Stroke in her maternal grandfather, mother, paternal grandfather, and sister.  Allergies  Allergen Reactions  . Codeine Nausea Only       PHYSICAL  EXAMINATION: Vital signs: BP (!) 158/70 (BP Location: Left Arm, Patient Position: Sitting, Cuff Size: Normal)   Pulse 68   Ht 4\' 10"  (1.473 m)   Wt 86 lb 3.2 oz (39.1 kg)   BMI 18.02 kg/m   Constitutional: Pleasant, thin generally well-appearing, no acute distress Psychiatric: alert and oriented x3, cooperative Eyes: extraocular movements intact, anicteric, conjunctiva pink Mouth: oral pharynx moist, no lesions Neck: supple no lymphadenopathy Cardiovascular: heart regular rate and rhythm, no murmur Lungs: clear to auscultation bilaterally Abdomen: soft, nontender, nondistended, no obvious ascites, no peritoneal signs, normal bowel sounds, no organomegaly Rectal:Omitted Extremities: no clubbing cyanosis or lower extremity edema bilaterally Skin: no lesions on visible extremities Neuro: No focal deficits. No asterixis.   ASSESSMENT:  #1. Normocytic anemia. Mild. No change in hemoglobin in at least 6 years. Nothing to suggest a GI etiology for this mild chronic normocytic anemia #2. 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 Otherwise normal #3. Negative GI review of systems  #4. Previous stool Hemoccult negative   PLAN:  #1. Anemia panel today #2. If anemia panel unrevealing. Return to Dr. Patsy Lageropland   ADDENDUM: Anemia panel reveals normal B12, folate, and iron studies. Normal ferritin  A copy of this consultation note has been sent to Dr. Patsy Lageropland

## 2016-01-16 NOTE — Patient Instructions (Addendum)
Your physician has requested that you go to the basement for the following lab work before leaving today: Iron Studies

## 2016-04-20 ENCOUNTER — Other Ambulatory Visit: Payer: Self-pay | Admitting: Family Medicine

## 2016-04-20 DIAGNOSIS — I10 Essential (primary) hypertension: Secondary | ICD-10-CM

## 2016-04-21 ENCOUNTER — Other Ambulatory Visit: Payer: Self-pay | Admitting: Emergency Medicine

## 2016-04-21 DIAGNOSIS — I10 Essential (primary) hypertension: Secondary | ICD-10-CM

## 2016-04-21 MED ORDER — LISINOPRIL 10 MG PO TABS: 10.0000 mg | ORAL_TABLET | Freq: Every day | ORAL | 0 refills | Status: DC

## 2016-05-12 NOTE — Progress Notes (Addendum)
[facility] [address] [place], [place] [address] [phone]  Date:  05/13/2016   Name:  Kristin Hamilton   DOB:  10/03/1940   MRN:  [id number]  PCP:  Abbe Amsterdam, MD    Chief Complaint: Follow-up (Pt here for 6 month f/u visit for HTN. Pt brought in bp log. )   History of Present Illness:  Kristin Hamilton is a 76 y.o. very pleasant female patient who presents with the following:  Last seen about 6 months ago-  We did a dexa scan which showed osteoporosis. She is on fosamax. Also history of HTN, anxiety, GERD Here today for her 6 month follow-up Last full labs about one year ago-   No HA, SOB or HA She brings in a BP log today- see below. Her home BP readings have been fine- will not adjust meds due to risk of hypotension She has not noted any sx of hypotension She is trying to maintain her weight  She ate oatmeal this am- otherwise she is fasting  She brings in a list of BP readings from this year- 136/67, 133/64, 124/ 88, 122/ 66, 145/ 71, 117/ 61, 140/66  Wt Readings from Last 3 Encounters:  05/13/16 88 lb 3.2 oz (40 kg)  01/16/16 86 lb 3.2 oz (39.1 kg)  11/13/15 85 lb 6.4 oz (38.7 kg)     Patient Active Problem List   Diagnosis Date Noted  . Osteoporosis 11/29/2013  . History of wrist fracture 04/18/2013  . Seborrheic keratosis 12/03/2011  . HTN (hypertension) 08/06/2011  . Anxiety 08/06/2011  . GERD (gastroesophageal reflux disease) 08/06/2011  . History of tobacco use 08/06/2011    Past Medical History:  Diagnosis Date  . Anemia   . Arthritis   . Cataract   . Chicken pox   . Constipation   . Depression   . GERD (gastroesophageal reflux disease)   . Hay fever   . Hypertension   . Osteoporosis     Past Surgical History:  Procedure Laterality Date  . WISDOM TOOTH EXTRACTION      Social History  Substance Use Topics  . Smoking status: Former Games developer  . Smokeless tobacco: Never  Used  . Alcohol use No    Family History  Problem Relation Age of Onset  . Cancer Father 84    pancreatic cancer  . Hypertension Father   . Breast cancer Sister   . Hypertension Sister   . Osteoporosis Sister   . Hyperlipidemia Sister   . Stroke Sister   . Ovarian cancer Sister   . Osteoporosis Sister   . Hypertension Brother   . Stroke Mother   . Heart disease Mother   . Hypertension Mother   . Heart disease Maternal Grandmother   . Stroke Maternal Grandfather   . Stroke Paternal Grandfather     Allergies  Allergen Reactions  . Codeine Nausea Only    Medication list has been reviewed and updated.  Current Outpatient Prescriptions on File Prior to Visit  Medication Sig Dispense Refill  . alendronate (FOSAMAX) 70 MG tablet TAKE 1 TABLET BY MOUTH EVERY 7 DAYS. TAKE WITH A FULL GLASS OF WATER ON AN EMPTY STOMACH 12 tablet 1  . lisinopril (PRINIVIL,ZESTRIL) 10 MG tablet Take 1 tablet (10 mg total) by mouth daily. 90 tablet 0  . OVER THE COUNTER MEDICATION OTC Allway eye drops for allergies prn    . Polyethyl Glycol-Propyl Glycol (  SYSTANE OP) Apply 1 drop to eye 2 (two) times daily as needed.      . Ranitidine HCl (ZANTAC 75 PO) Take 1 tablet by mouth as needed.       No current facility-administered medications on file prior to visit.     Review of Systems:  As per HPI- otherwise negative. No nausea, vomiting, diarrhea, rash, CP or SOB  Physical Examination: Vitals:   05/13/16 1108 05/13/16 1111  BP: (!) 161/95 (!) 167/67  Pulse: 94   Temp: 97.8 F (36.6 C)    Vitals:   05/13/16 1108  Weight: 88 lb 3.2 oz (40 kg)  Height:  (1.473 m)   Body mass index is 18.43 kg/m. Ideal Body Weight: Weight in (lb) to have BMI = 25: 119.4  GEN: WDWN, NAD, Non-toxic, A & O x 3, slight build, looks well HEENT: Atraumatic, Normocephalic. Neck supple. No masses, No LAD. Ears and Nose: No external deformity. CV: RRR, No M/G/R. No JVD. No thrill. No extra heart  sounds. PULM: CTA B, no wheezes, crackles, rhonchi. No retractions. No resp. distress. No accessory muscle use. ABD: S, NT, ND EXTR: No c/c/e NEURO Normal gait.  PSYCH: Normally interactive. Conversant. Not depressed or anxious appearing.  Calm demeanor.    Assessment and Plan: Essential hypertension - Plan: CBC, Comprehensive metabolic panel  Age-related osteoporosis without current pathological fracture - Plan: Comprehensive metabolic panel  Underweight  Dyslipidemia - Plan: Lipid panel  Here today for a periodic recheck She is feeling well Continues to be underweight- explained that this is contributing to her osteoporosis and encouraged her to gain weight- she will try but is hesitant to put on any lbs Her BP is generally acceptable at home and her DBP is on the low side- continue current medications Labs pending as above Recheck in 6 months  Signed Abbe Amsterdam, MD  Normal colonoscopy in 2012, negative fobt in 2015 and last year Letter to pt WG:NFAO  Your metabolic profile and cholesterol look great.  Your blood count is slightly abnormal.  As you know, you have had a slight anemia for the last 3 years.  Also, your white blood cell count is low.   I think these findings are benign but do want to follow them closely.   I would like to repeat your blood count in about 2 months (lab visit only) to monitor your levels.   Otherwise we can recheck in 6 months.    Results for orders placed or performed in visit on 05/13/16  CBC  Result Value Ref Range   WBC 3.7 (L) 4.0 - 10.5 K/uL   RBC 3.63 (L) 3.87 - 5.11 Mil/uL   Platelets 269.0 150.0 - 400.0 K/uL   Hemoglobin 11.1 (L) 12.0 - 15.0 g/dL   HCT 13.0 (L) 86.5 - 78.4 %   MCV 90.5 78.0 - 100.0 fl   MCHC 33.7 30.0 - 36.0 g/dL   RDW 69.6 29.5 - 28.4 %  Comprehensive metabolic panel  Result Value Ref Range   Sodium 136 135 - 145 mEq/L   Potassium 3.8 3.5 - 5.1 mEq/L   Chloride 105 96 - 112 mEq/L   CO2 25 19 - 32 mEq/L    Glucose, Bld 89 70 - 99 mg/dL   BUN 11 6 - 23 mg/dL   Creatinine, Ser 1.32 0.40 - 1.20 mg/dL   Total Bilirubin 0.4 0.2 - 1.2 mg/dL   Alkaline Phosphatase 55 39 - 117 U/L   AST 19 0 -  37 U/L   ALT 14 0 - 35 U/L   Total Protein 6.4 6.0 - 8.3 g/dL   Albumin 3.9 3.5 - 5.2 g/dL   Calcium 8.7 8.4 - 16.1 mg/dL   GFR 096.04 >54.09 mL/min  Lipid panel  Result Value Ref Range   Cholesterol 198 0 - 200 mg/dL   Triglycerides 81.1 0.0 - 149.0 mg/dL   HDL 91.47 >82.95 mg/dL   VLDL 62.1 0.0 - 30.8 mg/dL   LDL Cholesterol 657 (H) 0 - 99 mg/dL   Total CHOL/HDL Ratio 3    NonHDL 122.08

## 2016-05-13 ENCOUNTER — Ambulatory Visit (INDEPENDENT_AMBULATORY_CARE_PROVIDER_SITE_OTHER): Payer: Medicare Other | Admitting: Family Medicine

## 2016-05-13 ENCOUNTER — Encounter: Payer: Self-pay | Admitting: Family Medicine

## 2016-05-13 VITALS — BP 167/67 | HR 94 | Temp 97.8°F | Ht <= 58 in | Wt 88.2 lb

## 2016-05-13 DIAGNOSIS — E785 Hyperlipidemia, unspecified: Secondary | ICD-10-CM | POA: Diagnosis not present

## 2016-05-13 DIAGNOSIS — M810 Age-related osteoporosis without current pathological fracture: Secondary | ICD-10-CM

## 2016-05-13 DIAGNOSIS — D649 Anemia, unspecified: Secondary | ICD-10-CM

## 2016-05-13 DIAGNOSIS — R636 Underweight: Secondary | ICD-10-CM | POA: Diagnosis not present

## 2016-05-13 DIAGNOSIS — I10 Essential (primary) hypertension: Secondary | ICD-10-CM

## 2016-05-13 LAB — LIPID PANEL
CHOL/HDL RATIO: 3
Cholesterol: 198 mg/dL (ref 0–200)
HDL: 75.7 mg/dL (ref >39.00)
LDL CALC: 108 mg/dL — AB (ref 0–99)
NONHDL: 122.08
TRIGLYCERIDES: 71 mg/dL (ref 0.0–149.0)
VLDL: 14.2 mg/dL (ref 0.0–40.0)

## 2016-05-13 LAB — COMPREHENSIVE METABOLIC PANEL
ALT: 14 U/L (ref 0–35)
AST: 19 U/L (ref 0–37)
Albumin: 3.9 g/dL (ref 3.5–5.2)
Alkaline Phosphatase: 55 U/L (ref 39–117)
BILIRUBIN TOTAL: 0.4 mg/dL (ref 0.2–1.2)
BUN: 11 mg/dL (ref 6–23)
CALCIUM: 8.7 mg/dL (ref 8.4–10.5)
CHLORIDE: 105 meq/L (ref 96–112)
CO2: 25 meq/L (ref 19–32)
CREATININE: 0.57 mg/dL (ref 0.40–1.20)
GFR: 109.81 mL/min (ref >60.00)
GLUCOSE: 89 mg/dL (ref 70–99)
Potassium: 3.8 mEq/L (ref 3.5–5.1)
SODIUM: 136 meq/L (ref 135–145)
Total Protein: 6.4 g/dL (ref 6.0–8.3)

## 2016-05-13 LAB — CBC
HCT: 32.8 % — ABNORMAL LOW (ref 36.0–46.0)
Hemoglobin: 11.1 g/dL — ABNORMAL LOW (ref 12.0–15.0)
MCHC: 33.7 g/dL (ref 30.0–36.0)
MCV: 90.5 fl (ref 78.0–100.0)
Platelets: 269 10*3/uL (ref 150.0–400.0)
RBC: 3.63 Mil/uL — ABNORMAL LOW (ref 3.87–5.11)
RDW: 13.5 % (ref 11.5–15.5)
WBC: 3.7 10*3/uL — ABNORMAL LOW (ref 4.0–10.5)

## 2016-05-13 NOTE — Patient Instructions (Signed)
It was very nice to see you today!  Take care and I will be in touch with your labs.  I would still like to see you gain another 5 lbs- work on this gradually!   Assuming your labs look good we can plan to recheck in 6 months

## 2016-05-13 NOTE — Addendum Note (Signed)
Addended by: Abbe Amsterdam C on: 05/13/2016 09:29 PM   Modules accepted: Orders

## 2016-05-17 ENCOUNTER — Other Ambulatory Visit: Payer: Self-pay | Admitting: Family Medicine

## 2016-07-03 NOTE — Progress Notes (Addendum)
Subjective:   Kristin Hamilton is a 76 y.o. female who presents for Medicare Annual (Subsequent) preventive examination.   Review of Systems:  No ROS.  Medicare Wellness Visit.    Sleep patterns: Pt states usually sleeps 7-8 hrs. Wakes several times to urinate. Goes back to sleep easy. Feels rested. Home Safety/Smoke Alarms: Feels safe in home. Smoke alarms in place.  Living environment; residence and Firearm Safety: Lives with sister. No stairs. No guns. Seat Belt Safety/Bike Helmet: Wears seat belt.   Counseling:   Eye Exam- Wears glasses.Dr.Gilliam as needed. [facility]- [facility] 6 months.   Female:   Pap- Pt no longer screening.      Mammo- last 05/20/15: BI-RADS CATEGORY  1: Negative.   Pt states she is going to schedule appt. Dexa scan- Last 11/27/15:   Osteoporosis.   CCS- last 06/06/10: AVM's in the cecum. Otherwise normal.    Objective:     Vitals: BP 140/68 (BP Location: Right Arm, Patient Position: Sitting, Cuff Size: Normal)   Pulse 66   Ht 4' 10.5" (1.486 m)   Wt 87 lb 12.8 oz (39.8 kg)   SpO2 99%   BMI 18.04 kg/m   Body mass index is 18.04 kg/m.   Tobacco History  Smoking Status  . Former Smoker  Smokeless Tobacco  . Never Used     Counseling given: No   Past Medical History:  Diagnosis Date  . Anemia   . Arthritis   . Cataract   . Chicken pox   . Constipation   . Depression   . GERD (gastroesophageal reflux disease)   . Hay fever   . Hypertension   . Osteoporosis    Past Surgical History:  Procedure Laterality Date  . WISDOM TOOTH EXTRACTION     Family History  Problem Relation Age of Onset  . Cancer Father [id number]       pancreatic cancer  . Hypertension Father   . Breast cancer Sister   . Hypertension Sister   . Osteoporosis Sister   . Hyperlipidemia Sister   . Stroke Sister   . Ovarian cancer Sister   . Osteoporosis Sister   . Hypertension Brother   . Stroke Mother   . Heart disease Mother   . Hypertension Mother   . Heart  disease Maternal Grandmother   . Stroke Maternal Grandfather   . Stroke Paternal Grandfather    History  Sexual Activity  . Sexual activity: No    Outpatient Encounter Prescriptions as of 07/06/2016  Medication Sig  . alendronate (FOSAMAX) 70 MG tablet TAKE 1 TABLET BY MOUTH EVERY 7 DAYS. TAKE WITH A FULL GLASS OF WATER ON AN EMPTY STOMACH  . lisinopril (PRINIVIL,ZESTRIL) 10 MG tablet Take 1 tablet (10 mg total) by mouth daily.  Marland Kitchen. OVER THE COUNTER MEDICATION OTC Allway eye drops for allergies prn  . Polyethyl Glycol-Propyl Glycol (SYSTANE OP) Apply 1 drop to eye 2 (two) times daily as needed.    . Ranitidine HCl (ZANTAC 75 PO) Take 1 tablet by mouth as needed.     No facility-administered encounter medications on file as of 07/06/2016.     Activities of Daily Living In your present state of health, do you have any difficulty performing the following activities: 07/06/2016  Hearing? N  Vision? N  Difficulty concentrating or making decisions? N  Walking or climbing stairs? N  Dressing or bathing? N  Doing errands, shopping? N  Preparing Food and eating ? N  Using  the Toilet? N  In the past six months, have you accidently leaked urine? Y  Do you have problems with loss of bowel control? N  Managing your Medications? N  Managing your Finances? N  Housekeeping or managing your Housekeeping? N  Some recent data might be hidden    Patient Care Team: Copland, Gwenlyn Found, MD as PCP - General (Family Medicine) Maurice March, MD (Family Medicine) Iran Ouch, MD (Cardiology)    Assessment:    Physical assessment deferred to PCP.  Exercise Activities and Dietary recommendations Current Exercise Habits: Home exercise routine, Type of exercise: walking;yoga, Time (Minutes): 30, Frequency (Times/Week): 5, Weekly Exercise (Minutes/Week): 150, Intensity: Mild   Diet (meal preparation, eat out, water intake, caffeinated beverages, dairy products, fruits and vegetables): in  general, a "healthy" diet   Pt states she is vegetarian. States she eats eggs for  Protein.  Goals    . Pt states she would like to eventually live alone again.      Fall Risk Fall Risk  07/06/2016 11/13/2015 11/20/2014 10/23/2014 04/24/2014  Falls in the past year? No No No No No   Depression Screen PHQ 2/9 Scores 07/06/2016 11/13/2015 11/20/2014 10/23/2014  PHQ - 2 Score 0 0 0 2  PHQ- 9 Score - - - 6     Cognitive Function MMSE - Mini Mental State Exam 07/06/2016  Orientation to time 5  Orientation to Place 5  Registration 3  Attention/ Calculation 5  Recall 2  Language- name 2 objects 2  Language- repeat 1  Language- follow 3 step command 3  Language- read & follow direction 1  Write a sentence 1  Copy design 1  Total score 29        Immunization History  Administered Date(s) Administered  . Influenza, High Dose Seasonal PF 11/13/2015  . Influenza,inj,Quad PF,36+ Mos 12/13/2012, 10/10/2013, 10/23/2014  . Influenza-Unspecified 10/29/2011  . Pneumococcal Conjugate-13 05/02/2015  . Pneumococcal Polysaccharide-23 12/02/2011  . Td 04/18/2013  . Zoster 02/09/2012   Screening Tests Health Maintenance  Topic Date Due  . INFLUENZA VACCINE  08/26/2016  . COLONOSCOPY  06/05/2020  . TETANUS/TDAP  04/19/2023  . DEXA SCAN  Completed  . PNA vac Low Risk Adult  Completed      Plan:   Follow up with Dr.Copland as scheduled 11/12/16.  Continue to eat heart healthy diet (full of fruits, vegetables, whole grains, lean protein, water--limit salt, fat, and sugar intake) and increase physical activity as tolerated.  Continue doing brain stimulating activities (puzzles, reading, adult coloring books, staying active) to keep memory sharp.   Bring a copy of your advance directives to your next office visit.  Schedule mammogram.     I have personally reviewed and noted the following in the patient's chart:   . Medical and social history . Use of alcohol, tobacco or illicit  drugs  . Current medications and supplements . Functional ability and status . Nutritional status . Physical activity . Advanced directives . List of other physicians . Hospitalizations, surgeries, and ER visits in previous 12 months . Vitals . Screenings to include cognitive, depression, and falls . Referrals and appointments  In addition, I have reviewed and discussed with patient certain preventive protocols, quality metrics, and best practice recommendations. A written personalized care plan for preventive services as well as general preventive health recommendations were provided to patient.     Avon Gully, RN  07/06/2016    I have reviewed the above note by  Ms. Moshe Cipro and agree with her documentation J Copland MD

## 2016-07-06 ENCOUNTER — Ambulatory Visit (INDEPENDENT_AMBULATORY_CARE_PROVIDER_SITE_OTHER): Payer: Medicare Other | Admitting: *Deleted

## 2016-07-06 ENCOUNTER — Encounter: Payer: Self-pay | Admitting: *Deleted

## 2016-07-06 VITALS — BP 140/68 | HR 66 | Ht 58.5 in | Wt 87.8 lb

## 2016-07-06 DIAGNOSIS — Z0000 Encounter for general adult medical examination without abnormal findings: Secondary | ICD-10-CM | POA: Diagnosis not present

## 2016-07-06 NOTE — Patient Instructions (Addendum)
Kristin Hamilton , Thank you for taking time to come for your Medicare Wellness Visit. I appreciate your ongoing commitment to your health goals. Please review the following plan we discussed and let me know if I can assist you in the future.   These are the goals we discussed: Goals    . Pt states she would like to eventually live alone again.       This is a list of the screening recommended for you and due dates:  Health Maintenance  Topic Date Due  . Flu Shot  08/26/2016  . Colon Cancer Screening  06/05/2020  . Tetanus Vaccine  04/19/2023  . DEXA scan (bone density measurement)  Completed  . Pneumonia vaccines  Completed   Follow up with Dr.Copland as scheduled 11/12/16.  Continue to eat heart healthy diet (full of fruits, vegetables, whole grains, lean protein, water--limit salt, fat, and sugar intake) and increase physical activity as tolerated.  Continue doing brain stimulating activities (puzzles, reading, adult coloring books, staying active) to keep memory sharp.   Bring a copy of your advance directives to your next office visit.  Schedule mammogram.   Health Maintenance for Postmenopausal Women Menopause is a normal process in which your reproductive ability comes to an end. This process happens gradually over a span of months to years, usually between the ages of 59 and 75. Menopause is complete when you have missed 12 consecutive menstrual periods. It is important to talk with your health care provider about some of the most common conditions that affect postmenopausal women, such as heart disease, cancer, and bone loss (osteoporosis). Adopting a healthy lifestyle and getting preventive care can help to promote your health and wellness. Those actions can also lower your chances of developing some of these common conditions. What should I know about menopause? During menopause, you may experience a number of symptoms, such as:  Moderate-to-severe hot flashes.  Night  sweats.  Decrease in sex drive.  Mood swings.  Headaches.  Tiredness.  Irritability.  Memory problems.  Insomnia.  Choosing to treat or not to treat menopausal changes is an individual decision that you make with your health care provider. What should I know about hormone replacement therapy and supplements? Hormone therapy products are effective for treating symptoms that are associated with menopause, such as hot flashes and night sweats. Hormone replacement carries certain risks, especially as you become older. If you are thinking about using estrogen or estrogen with progestin treatments, discuss the benefits and risks with your health care provider. What should I know about heart disease and stroke? Heart disease, heart attack, and stroke become more likely as you age. This may be due, in part, to the hormonal changes that your body experiences during menopause. These can affect how your body processes dietary fats, triglycerides, and cholesterol. Heart attack and stroke are both medical emergencies. There are many things that you can do to help prevent heart disease and stroke:  Have your blood pressure checked at least every 1-2 years. High blood pressure causes heart disease and increases the risk of stroke.  If you are 44-20 years old, ask your health care provider if you should take aspirin to prevent a heart attack or a stroke.  Do not use any tobacco products, including cigarettes, chewing tobacco, or electronic cigarettes. If you need help quitting, ask your health care provider.  It is important to eat a healthy diet and maintain a healthy weight. ? Be sure to include plenty of  vegetables, fruits, low-fat dairy products, and lean protein. ? Avoid eating foods that are high in solid fats, added sugars, or salt (sodium).  Get regular exercise. This is one of the most important things that you can do for your health. ? Try to exercise for at least 150 minutes each week.  The type of exercise that you do should increase your heart rate and make you sweat. This is known as moderate-intensity exercise. ? Try to do strengthening exercises at least twice each week. Do these in addition to the moderate-intensity exercise.  Know your numbers.Ask your health care provider to check your cholesterol and your blood glucose. Continue to have your blood tested as directed by your health care provider.  What should I know about cancer screening? There are several types of cancer. Take the following steps to reduce your risk and to catch any cancer development as early as possible. Breast Cancer  Practice breast self-awareness. ? This means understanding how your breasts normally appear and feel. ? It also means doing regular breast self-exams. Let your health care provider know about any changes, no matter how small.  If you are 33 or older, have a clinician do a breast exam (clinical breast exam or CBE) every year. Depending on your age, family history, and medical history, it may be recommended that you also have a yearly breast X-ray (mammogram).  If you have a family history of breast cancer, talk with your health care provider about genetic screening.  If you are at high risk for breast cancer, talk with your health care provider about having an MRI and a mammogram every year.  Breast cancer (BRCA) gene test is recommended for women who have family members with BRCA-related cancers. Results of the assessment will determine the need for genetic counseling and BRCA1 and for BRCA2 testing. BRCA-related cancers include these types: ? Breast. This occurs in males or females. ? Ovarian. ? Tubal. This may also be called fallopian tube cancer. ? Cancer of the abdominal or pelvic lining (peritoneal cancer). ? Prostate. ? Pancreatic.  Cervical, Uterine, and Ovarian Cancer Your health care provider may recommend that you be screened regularly for cancer of the pelvic  organs. These include your ovaries, uterus, and vagina. This screening involves a pelvic exam, which includes checking for microscopic changes to the surface of your cervix (Pap test).  For women ages 21-65, health care providers may recommend a pelvic exam and a Pap test every three years. For women ages 83-65, they may recommend the Pap test and pelvic exam, combined with testing for human papilloma virus (HPV), every five years. Some types of HPV increase your risk of cervical cancer. Testing for HPV may also be done on women of any age who have unclear Pap test results.  Other health care providers may not recommend any screening for nonpregnant women who are considered low risk for pelvic cancer and have no symptoms. Ask your health care provider if a screening pelvic exam is right for you.  If you have had past treatment for cervical cancer or a condition that could lead to cancer, you need Pap tests and screening for cancer for at least 20 years after your treatment. If Pap tests have been discontinued for you, your risk factors (such as having a new sexual partner) need to be reassessed to determine if you should start having screenings again. Some women have medical problems that increase the chance of getting cervical cancer. In these cases, your health care  provider may recommend that you have screening and Pap tests more often.  If you have a family history of uterine cancer or ovarian cancer, talk with your health care provider about genetic screening.  If you have vaginal bleeding after reaching menopause, tell your health care provider.  There are currently no reliable tests available to screen for ovarian cancer.  Lung Cancer Lung cancer screening is recommended for adults 87-39 years old who are at high risk for lung cancer because of a history of smoking. A yearly low-dose CT scan of the lungs is recommended if you:  Currently smoke.  Have a history of at least 30 pack-years of  smoking and you currently smoke or have quit within the past 15 years. A pack-year is smoking an average of one pack of cigarettes per day for one year.  Yearly screening should:  Continue until it has been 15 years since you quit.  Stop if you develop a health problem that would prevent you from having lung cancer treatment.  Colorectal Cancer  This type of cancer can be detected and can often be prevented.  Routine colorectal cancer screening usually begins at age 23 and continues through age 50.  If you have risk factors for colon cancer, your health care provider may recommend that you be screened at an earlier age.  If you have a family history of colorectal cancer, talk with your health care provider about genetic screening.  Your health care provider may also recommend using home test kits to check for hidden blood in your stool.  A small camera at the end of a tube can be used to examine your colon directly (sigmoidoscopy or colonoscopy). This is done to check for the earliest forms of colorectal cancer.  Direct examination of the colon should be repeated every 5-10 years until age 12. However, if early forms of precancerous polyps or small growths are found or if you have a family history or genetic risk for colorectal cancer, you may need to be screened more often.  Skin Cancer  Check your skin from head to toe regularly.  Monitor any moles. Be sure to tell your health care provider: ? About any new moles or changes in moles, especially if there is a change in a mole's shape or color. ? If you have a mole that is larger than the size of a pencil eraser.  If any of your family members has a history of skin cancer, especially at a young age, talk with your health care provider about genetic screening.  Always use sunscreen. Apply sunscreen liberally and repeatedly throughout the day.  Whenever you are outside, protect yourself by wearing long sleeves, pants, a wide-brimmed  hat, and sunglasses.  What should I know about osteoporosis? Osteoporosis is a condition in which bone destruction happens more quickly than new bone creation. After menopause, you may be at an increased risk for osteoporosis. To help prevent osteoporosis or the bone fractures that can happen because of osteoporosis, the following is recommended:  If you are 28-67 years old, get at least 1,000 mg of calcium and at least 600 mg of vitamin D per day.  If you are older than age 86 but younger than age 74, get at least 1,200 mg of calcium and at least 600 mg of vitamin D per day.  If you are older than age 63, get at least 1,200 mg of calcium and at least 800 mg of vitamin D per day.  Smoking and  excessive alcohol intake increase the risk of osteoporosis. Eat foods that are rich in calcium and vitamin D, and do weight-bearing exercises several times each week as directed by your health care provider. What should I know about how menopause affects my mental health? Depression may occur at any age, but it is more common as you become older. Common symptoms of depression include:  Low or sad mood.  Changes in sleep patterns.  Changes in appetite or eating patterns.  Feeling an overall lack of motivation or enjoyment of activities that you previously enjoyed.  Frequent crying spells.  Talk with your health care provider if you think that you are experiencing depression. What should I know about immunizations? It is important that you get and maintain your immunizations. These include:  Tetanus, diphtheria, and pertussis (Tdap) booster vaccine.  Influenza every year before the flu season begins.  Pneumonia vaccine.  Shingles vaccine.  Your health care provider may also recommend other immunizations. This information is not intended to replace advice given to you by your health care provider. Make sure you discuss any questions you have with your health care provider. Document Released:  03/06/2005 Document Revised: 08/02/2015 Document Reviewed: 10/16/2014 Elsevier Interactive Patient Education  2018 [employer].

## 2016-07-16 ENCOUNTER — Other Ambulatory Visit: Payer: Self-pay | Admitting: Family Medicine

## 2016-07-16 DIAGNOSIS — I10 Essential (primary) hypertension: Secondary | ICD-10-CM

## 2016-10-11 ENCOUNTER — Other Ambulatory Visit: Payer: Self-pay | Admitting: Family Medicine

## 2016-10-11 DIAGNOSIS — I10 Essential (primary) hypertension: Secondary | ICD-10-CM

## 2016-10-28 ENCOUNTER — Other Ambulatory Visit: Payer: Self-pay | Admitting: Family Medicine

## 2016-11-11 NOTE — Progress Notes (Addendum)
[facility] [address], [address] [place], [place] [address] [phone]  Date:  11/12/2016   Name:  Kristin Hamilton   DOB:  [birth year]   MRN:  [id number]  PCP:  [name], MD    Chief Complaint: Follow-up (Pt here for f/u visit. Flu vaccine given today. )   History of Present Illness:  Kristin Hamilton is a 76 y.o. very pleasant female patient who presents with the following:  6 month follow-up visit today History of HTN, smoking, GERD, anxiety, osteoporosis Last seen by myself in April of this year:  We did a dexa scan which showed osteoporosis. She is on fosamax. Also history of HTN, anxiety, GERD Here today for her 6 month follow-up Last full labs about one year ago-   No HA, SOB or HA She brings in a BP log today- see below. Her home BP readings have been fine- will not adjust meds due to risk of hypotension She has not noted any sx of hypotension She is trying to maintain her weight  Flu shot: done today Labs: done in April, mild anemia which is stable  She is well today Her summer was hectic as her sister came to live with her while she was ill over the summer- she is now feeling better and back at home.  Myriam Jacobson does live with another sister full time  they have a dog who they enjoy taking on walks together She does try to walk for exercise Last dexa a year ago, she is taking fosamax  She is holding her weight although she is still very petite   Wt Readings from Last 3 Encounters:  11/12/16 88 lb 9.6 oz (40.2 kg)  07/06/16 87 lb 12.8 oz (39.8 kg)  05/13/16 88 lb 3.2 oz (40 kg)    BP Readings from Last 3 Encounters:  11/12/16 (!) 150/80  07/06/16 140/68  05/13/16 (!) 167/67   She has checked her BP a few times since se saw her last  119/55, 137/63, 120/62 No headaches  Colonoscopy 2012- 10 year follow-up    Patient Active Problem List   Diagnosis Date Noted  . Osteoporosis 11/29/2013  .  History of wrist fracture 04/18/2013  . Seborrheic keratosis 12/03/2011  . HTN (hypertension) 08/06/2011  . Anxiety 08/06/2011  . GERD (gastroesophageal reflux disease) 08/06/2011  . History of tobacco use 08/06/2011    Past Medical History:  Diagnosis Date  . Anemia   . Arthritis   . Cataract   . Chicken pox   . Constipation   . Depression   . GERD (gastroesophageal reflux disease)   . Hay fever   . Hypertension   . Osteoporosis     Past Surgical History:  Procedure Laterality Date  . WISDOM TOOTH EXTRACTION      Social History  Substance Use Topics  . Smoking status: Former Games developer  . Smokeless tobacco: Never Used  . Alcohol use No    Family History  Problem Relation Age of Onset  . Cancer Father 73       pancreatic cancer  . Hypertension Father   . Breast cancer Sister   . Hypertension Sister   . Osteoporosis Sister   . Hyperlipidemia Sister   . Stroke Sister   . Ovarian cancer Sister   . Osteoporosis Sister   . Hypertension Brother   . Stroke Mother   . Heart disease Mother   . Hypertension  Mother   . Heart disease Maternal Grandmother   . Stroke Maternal Grandfather   . Stroke Paternal Grandfather     Allergies  Allergen Reactions  . Codeine Nausea Only    Medication list has been reviewed and updated.  Current Outpatient Prescriptions on File Prior to Visit  Medication Sig Dispense Refill  . alendronate (FOSAMAX) 70 MG tablet TAKE 1 TABLET BY MOUTH EVERY 7 DAYS. TAKE WITH A FULL GLASS OF WATER ON AN EMPTY STOMACH 12 tablet 1  . lisinopril (PRINIVIL,ZESTRIL) 10 MG tablet TAKE 1 TABLET (10 MG TOTAL) BY MOUTH DAILY. 90 tablet 0  . OVER THE COUNTER MEDICATION OTC [facility]way eye drops for allergies prn    . Polyethyl Glycol-Propyl Glycol (SYSTANE OP) Apply 1 drop to eye 2 (two) times daily as needed.      . Ranitidine HCl (ZANTAC 75 PO) Take 1 tablet by mouth as needed.       No current facility-administered medications on file prior to visit.      Review of Systems:  As per HPI- otherwise negative. No abdominal/ digestion concerns Feels well   Physical Examination: Vitals:   11/12/16 1110 11/12/16 1127  BP: (!) 167/61 (!) 150/80  Pulse:    Temp:    SpO2:     Vitals:   11/12/16 1109  Weight: 88 lb 9.6 oz (40.2 kg)  Height: 4\' 10"  (1.473 m)   Body mass index is 18.52 kg/m. Ideal Body Weight: Weight in (lb) to have BMI = 25: 119.4  GEN: WDWN, NAD, Non-toxic, A & O x 3, very petite, looks well today HEENT: Atraumatic, Normocephalic. Neck supple. No masses, No LAD. Ears and Nose: No external deformity. CV: RRR, No M/G/R. No JVD. No thrill. No extra heart sounds. PULM: CTA B, no wheezes, crackles, rhonchi. No retractions. No resp. distress. No accessory muscle use. ABD: S, NT, ND. EXTR: No c/c/e NEURO Normal gait.  PSYCH: Normally interactive. Conversant. Not depressed or anxious appearing.  Calm demeanor.    Assessment and Plan: Medication monitoring encounter - Plan: Basic metabolic panel, CBC  Essential hypertension  Age-related osteoporosis without current pathological fracture  Underweight  Immunization due - Plan: Flu vaccine HIGH DOSE PF (Fluzone High dose)  Here today for a recheck visit BP is generally well controlled at home- she will continue to monitor and I will not raise her medication due to risk of hypotension She is still borderline underweight but is holding steady Flu shot today Will plan further follow- up pending labs. Continue medication for osteoporosis Recheck here in 6 months for her CPE   Signed Abbe AmsterdamJessica Dahir Ayer, MD  Received her labs  Results for orders placed or performed in visit on 11/12/16  Basic metabolic panel  Result Value Ref Range   Sodium 135 135 - 145 mEq/L   Potassium 4.5 3.5 - 5.1 mEq/L   Chloride 102 96 - 112 mEq/L   CO2 26 19 - 32 mEq/L   Glucose, Bld 105 (H) 70 - 99 mg/dL   BUN 11 6 - 23 mg/dL   Creatinine, Ser 1.610.64 0.40 - 1.20 mg/dL   Calcium 8.9  8.4 - 09.610.5 mg/dL   GFR 04.5495.94 >09.81>60.00 mL/min  CBC  Result Value Ref Range   WBC 4.6 4.0 - 10.5 K/uL   RBC 3.87 3.87 - 5.11 Mil/uL   Platelets 294.0 150.0 - 400.0 K/uL   Hemoglobin 11.7 (L) 12.0 - 15.0 g/dL   HCT 19.135.3 (L) 47.836.0 - 29.546.0 %   MCV  91.1 78.0 - 100.0 fl   MCHC 33.1 30.0 - 36.0 g/dL   RDW 40.9 81.1 - 91.4 %   Mild anemia is stable to 2015, and she did a negative FOBT in 2017 Letter to pt

## 2016-11-12 ENCOUNTER — Ambulatory Visit (INDEPENDENT_AMBULATORY_CARE_PROVIDER_SITE_OTHER): Payer: Medicare Other | Admitting: Family Medicine

## 2016-11-12 VITALS — BP 150/80 | HR 64 | Temp 97.9°F | Ht <= 58 in | Wt 88.6 lb

## 2016-11-12 DIAGNOSIS — Z23 Encounter for immunization: Secondary | ICD-10-CM | POA: Diagnosis not present

## 2016-11-12 DIAGNOSIS — Z5181 Encounter for therapeutic drug level monitoring: Secondary | ICD-10-CM | POA: Diagnosis not present

## 2016-11-12 DIAGNOSIS — I10 Essential (primary) hypertension: Secondary | ICD-10-CM

## 2016-11-12 DIAGNOSIS — M810 Age-related osteoporosis without current pathological fracture: Secondary | ICD-10-CM

## 2016-11-12 DIAGNOSIS — R636 Underweight: Secondary | ICD-10-CM

## 2016-11-12 LAB — CBC
HCT: 35.3 % — ABNORMAL LOW (ref 36.0–46.0)
Hemoglobin: 11.7 g/dL — ABNORMAL LOW (ref 12.0–15.0)
MCHC: 33.1 g/dL (ref 30.0–36.0)
MCV: 91.1 fl (ref 78.0–100.0)
Platelets: 294 10*3/uL (ref 150.0–400.0)
RBC: 3.87 Mil/uL (ref 3.87–5.11)
RDW: 13.1 % (ref 11.5–15.5)
WBC: 4.6 10*3/uL (ref 4.0–10.5)

## 2016-11-12 LAB — BASIC METABOLIC PANEL
BUN: 11 mg/dL (ref 6–23)
CALCIUM: 8.9 mg/dL (ref 8.4–10.5)
CO2: 26 mEq/L (ref 19–32)
CREATININE: 0.64 mg/dL (ref 0.40–1.20)
Chloride: 102 mEq/L (ref 96–112)
GFR: 95.94 mL/min (ref >60.00)
Glucose, Bld: 105 mg/dL — ABNORMAL HIGH (ref 70–99)
Potassium: 4.5 mEq/L (ref 3.5–5.1)
Sodium: 135 mEq/L (ref 135–145)

## 2016-11-12 NOTE — Patient Instructions (Signed)
It was great to see you today as always!  Keep up the good work with exercise Please do monitor your BP at home perhaps once a week and let me know if you are going over 140/90 at home  Take care and let's visit in 6 months

## 2017-01-13 ENCOUNTER — Other Ambulatory Visit: Payer: Self-pay | Admitting: Family Medicine

## 2017-01-13 DIAGNOSIS — I10 Essential (primary) hypertension: Secondary | ICD-10-CM

## 2017-01-25 ENCOUNTER — Other Ambulatory Visit: Payer: Self-pay | Admitting: Family Medicine

## 2017-01-25 DIAGNOSIS — I10 Essential (primary) hypertension: Secondary | ICD-10-CM

## 2017-04-09 ENCOUNTER — Other Ambulatory Visit: Payer: Self-pay | Admitting: Emergency Medicine

## 2017-04-09 MED ORDER — ALENDRONATE SODIUM 70 MG PO TABS: ORAL_TABLET | ORAL | 1 refills | Status: DC

## 2017-04-12 ENCOUNTER — Other Ambulatory Visit: Payer: Self-pay | Admitting: Family Medicine

## 2017-04-12 DIAGNOSIS — I10 Essential (primary) hypertension: Secondary | ICD-10-CM

## 2017-07-05 NOTE — Progress Notes (Addendum)
Subjective:   Kristin Hamilton is a 77 y.o. female who presents for Medicare Annual (Subsequent) preventive examination.  Review of Systems: No ROS.  Medicare Wellness Visit. Additional risk factors are reflected in the social history.   Sleep patterns: Sleeps 7-8 hrs. Feels rested  Home Safety/Smoke Alarms: Feels safe in home. Smoke alarms in place.  Living environment; residence and Firearm Safety: Lives with sister. 1 story home. No stairs.   Female:        Mammo-ordered       Dexa scan-ordered       CCS-06/06/10          Objective:     Vitals: BP (!) 142/60 (BP Location: Left Arm, Patient Position: Sitting, Cuff Size: Normal)   Pulse 66   Ht 4\' 10"  (1.473 m)   Wt 86 lb 12.8 oz (39.4 kg)   SpO2 98%   BMI 18.14 kg/m   Body mass index is 18.14 kg/m.  Advanced Directives 07/08/2017 07/06/2016  Does Patient Have a Medical Advance Directive? No No  Does patient want to make changes to medical advance directive? Yes (MAU/Ambulatory/Procedural Areas - Information given) -  Would patient like information on creating a medical advance directive? - Yes (MAU/Ambulatory/Procedural Areas - Information given)    Tobacco Social History   Tobacco Use  Smoking Status Former Smoker  Smokeless Tobacco Never Used     Counseling given: Not Answered   Clinical Intake: Pain : No/denies pain     Past Medical History:  Diagnosis Date  . Anemia   . Arthritis   . Cataract   . Chicken pox   . Constipation   . Depression   . GERD (gastroesophageal reflux disease)   . Hay fever   . Hypertension   . Osteoporosis    Past Surgical History:  Procedure Laterality Date  . WISDOM TOOTH EXTRACTION     Family History  Problem Relation Age of Onset  . Cancer Father 64       pancreatic cancer  . Hypertension Father   . Breast cancer Sister   . Hypertension Sister   . Osteoporosis Sister   . Hyperlipidemia Sister   . Stroke Sister   . Ovarian cancer Sister   . Osteoporosis Sister    . Hypertension Brother   . Stroke Mother   . Heart disease Mother   . Hypertension Mother   . Heart disease Maternal Grandmother   . Stroke Maternal Grandfather   . Stroke Paternal Grandfather    Social History   Socioeconomic History  . Marital status: Single    Spouse name: Not on file  . Number of children: Not on file  . Years of education: Not on file  . Highest education level: Not on file  Occupational History  . Occupation: retired    Associate Professor: [employer]  Social Needs  . Financial resource strain: Not on file  . Food insecurity:    Worry: Not on file    Inability: Not on file  . Transportation needs:    Medical: Not on file    Non-medical: Not on file  Tobacco Use  . Smoking status: Former Games developer  . Smokeless tobacco: Never Used  Substance and Sexual Activity  . Alcohol use: No  . Drug use: No  . Sexual activity: Never  Lifestyle  . Physical activity:    Days per week: Not on file    Minutes per session: Not on file  . Stress: Not on  file  Relationships  . Social connections:    Talks on phone: Not on file    Gets together: Not on file    Attends religious service: Not on file    Active member of club or organization: Not on file    Attends meetings of clubs or organizations: Not on file    Relationship status: Not on file  Other Topics Concern  . Not on file  Social History Narrative  . Not on file    Outpatient Encounter Medications as of 07/08/2017  Medication Sig  . alendronate (FOSAMAX) 70 MG tablet TAKE 1 TABLET BY MOUTH EVERY 7 DAYS. TAKE WITH A FULL GLASS OF WATER ON AN EMPTY STOMACH  . lisinopril (PRINIVIL,ZESTRIL) 10 MG tablet Take 1 tablet (10 mg total) by mouth daily.  Marland Kitchen. OVER THE COUNTER MEDICATION OTC Allway eye drops for allergies prn  . Polyethyl Glycol-Propyl Glycol (SYSTANE OP) Apply 1 drop to eye 2 (two) times daily as needed.    . Ranitidine HCl (ZANTAC 75 PO) Take 1 tablet by mouth as needed.    . [DISCONTINUED]  alendronate (FOSAMAX) 70 MG tablet TAKE 1 TABLET BY MOUTH EVERY 7 DAYS. TAKE WITH A FULL GLASS OF WATER ON AN EMPTY STOMACH  . [DISCONTINUED] lisinopril (PRINIVIL,ZESTRIL) 10 MG tablet TAKE 1 TABLET (10 MG TOTAL) BY MOUTH DAILY.   No facility-administered encounter medications on file as of 07/08/2017.     Activities of Daily Living In your present state of health, do you have any difficulty performing the following activities: 07/08/2017 07/07/2017  Hearing? N N  Vision? N N  Difficulty concentrating or making decisions? N N  Walking or climbing stairs? N N  Dressing or bathing? N N  Doing errands, shopping? N N  Preparing Food and eating ? N -  Using the Toilet? N -  In the past six months, have you accidently leaked urine? Y -  Comment wear pads -  Do you have problems with loss of bowel control? N -  Managing your Medications? N -  Managing your Finances? N -  Housekeeping or managing your Housekeeping? N -  Some recent data might be hidden    Patient Care Team: Copland, Gwenlyn FoundJessica C, MD as PCP - General (Family Medicine) Maurice MarchMcPherson, Barbara B, MD (Family Medicine) Iran OuchArida, Muhammad A, MD (Cardiology)    Assessment:   This is a routine wellness examination for Kristin Hamilton. Physical assessment deferred to PCP.  Exercise Activities and Dietary recommendations Current Exercise Habits: Home exercise routine, Time (Minutes): 30, Frequency (Times/Week): 5, Weekly Exercise (Minutes/Week): 150, Exercise limited by: None identified   Diet (meal preparation, eat out, water intake, caffeinated beverages, dairy products, fruits and vegetables): per pt--Vegetarian. Eat eggs for protein.   Goals    . Do weight bearing exercises.       Fall Risk Fall Risk  07/08/2017 07/07/2017 07/06/2016 11/13/2015 11/20/2014  Falls in the past year? No No No No No   Depression Screen PHQ 2/9 Scores 07/08/2017 07/07/2017 07/06/2016 11/13/2015  PHQ - 2 Score 0 0 0 0  PHQ- 9 Score - - - -     Cognitive  Function MMSE - Mini Mental State Exam 07/08/2017 07/06/2016  Orientation to time 5 5  Orientation to Place 5 5  Registration 3 3  Attention/ Calculation 5 5  Recall 0 2  Language- name 2 objects 2 2  Language- repeat 1 1  Language- follow 3 step command 3 3  Language- read & follow direction  1 1  Write a sentence 1 1  Copy design 1 1  Total score 27 29        Immunization History  Administered Date(s) Administered  . Influenza, High Dose Seasonal PF 11/13/2015, 11/12/2016  . Influenza,inj,Quad PF,6+ Mos 12/13/2012, 10/10/2013, 10/23/2014  . Influenza-Unspecified 10/29/2011  . Pneumococcal Conjugate-13 05/02/2015  . Pneumococcal Polysaccharide-23 12/02/2011  . Td 04/18/2013  . Zoster 02/09/2012   Screening Tests Health Maintenance  Topic Date Due  . INFLUENZA VACCINE  08/26/2017  . TETANUS/TDAP  04/19/2023  . DEXA SCAN  Completed  . PNA vac Low Risk Adult  Completed      Plan:    Please schedule your next medicare wellness visit with me in 1 yr.  Continue to eat heart healthy diet (full of fruits, vegetables, whole grains, lean protein, water--limit salt, fat, and sugar intake) and increase physical activity as tolerated.  Continue doing brain stimulating activities (puzzles, reading, adult coloring books, staying active) to keep memory sharp.     I have personally reviewed and noted the following in the patient's chart:   . Medical and social history . Use of alcohol, tobacco or illicit drugs  . Current medications and supplements . Functional ability and status . Nutritional status . Physical activity . Advanced directives . List of other physicians . Hospitalizations, surgeries, and ER visits in previous 12 months . Vitals . Screenings to include cognitive, depression, and falls . Referrals and appointments  In addition, I have reviewed and discussed with patient certain preventive protocols, quality metrics, and best practice recommendations. A written  personalized care plan for preventive services as well as general preventive health recommendations were provided to patient.     Avon Gully, RN  07/08/2017   I have reviewed the above MWE by Ms. Moshe Cipro and agree with her documentationArthor Captain MD

## 2017-07-06 ENCOUNTER — Other Ambulatory Visit: Payer: Self-pay | Admitting: Family Medicine

## 2017-07-06 DIAGNOSIS — I10 Essential (primary) hypertension: Secondary | ICD-10-CM

## 2017-07-06 NOTE — Progress Notes (Addendum)
[facility] [address], [address] [place], [place] [address] [phone]  Date:  07/07/2017   Name:  Kristin Hamilton   DOB:  09-Jul-1940   MRN:  [id number]  PCP:  [name], MD    Chief Complaint: Hypertension (6 month follow up, no concerns)   History of Present Illness:  Kristin Hamilton is a 77 y.o. very pleasant female patient who presents with the following:  6 month follow-up today History of osteoporosis, HTN, GERD Fosamax, lisinopril , ranitidine  Last dexa 11/17, so she will be due this fall  Last seen here in October: Her summer was hectic as her sister came to live with her while she was ill over the summer- she is now feeling better and back at home.  Myriam Jacobson does live with another sister full time  they have a dog who they enjoy taking on walks together She does try to walk for exercise Last dexa a year ago, she is taking fosamax  She is holding her weight although she is still very petite   Wt Readings from Last 3 Encounters:  07/07/17 86 lb 9.6 oz (39.3 kg)  11/12/16 88 lb 9.6 oz (40.2 kg)  07/06/16 87 lb 12.8 oz (39.8 kg)   She is trying to eat well and maintain her weight- she is down a couple of lbs, asked her to work on regaining these  BP Readings from Last 3 Encounters:  07/07/17 118/60  11/12/16 (!) 150/80  07/06/16 140/68   She does check her BP at home Her home BP may run up to 130s/ 60s.  She brings in a list for me today: 119/55, 137/63,120/62, 137/62, 116/62  She had a light meal so far today, is ok with checking labs  Patient Active Problem List   Diagnosis Date Noted  . Osteoporosis 11/29/2013  . History of wrist fracture 04/18/2013  . Seborrheic keratosis 12/03/2011  . HTN (hypertension) 08/06/2011  . Anxiety 08/06/2011  . GERD (gastroesophageal reflux disease) 08/06/2011  . History of tobacco use 08/06/2011    Past Medical History:  Diagnosis Date  . Anemia   .  Arthritis   . Cataract   . Chicken pox   . Constipation   . Depression   . GERD (gastroesophageal reflux disease)   . Hay fever   . Hypertension   . Osteoporosis     Past Surgical History:  Procedure Laterality Date  . WISDOM TOOTH EXTRACTION      Social History   Tobacco Use  . Smoking status: Former Games developer  . Smokeless tobacco: Never Used  Substance Use Topics  . Alcohol use: No  . Drug use: No    Family History  Problem Relation Age of Onset  . Cancer Father 66       pancreatic cancer  . Hypertension Father   . Breast cancer Sister   . Hypertension Sister   . Osteoporosis Sister   . Hyperlipidemia Sister   . Stroke Sister   . Ovarian cancer Sister   . Osteoporosis Sister   . Hypertension Brother   . Stroke Mother   . Heart disease Mother   . Hypertension Mother   . Heart disease Maternal Grandmother   . Stroke Maternal Grandfather   . Stroke Paternal Grandfather     Allergies  Allergen Reactions  . Codeine Nausea Only    Medication list has been reviewed and updated.  Current Outpatient  Medications on File Prior to Visit  Medication Sig Dispense Refill  . alendronate (FOSAMAX) 70 MG tablet TAKE 1 TABLET BY MOUTH EVERY 7 DAYS. TAKE WITH A FULL GLASS OF WATER ON AN EMPTY STOMACH 12 tablet 1  . lisinopril (PRINIVIL,ZESTRIL) 10 MG tablet TAKE 1 TABLET (10 MG TOTAL) BY MOUTH DAILY. 30 tablet 0  . OVER THE COUNTER MEDICATION OTC Allway eye drops for allergies prn    . Polyethyl Glycol-Propyl Glycol (SYSTANE OP) Apply 1 drop to eye 2 (two) times daily as needed.      . Ranitidine HCl (ZANTAC 75 PO) Take 1 tablet by mouth as needed.       No current facility-administered medications on file prior to visit.     Review of Systems:  As per HPI- otherwise negative. No fever or chills No CP or SOB Does not fee lightheaded    Physical Examination: Vitals:   07/07/17 1152  BP: 118/60  Pulse: 68  Resp: 16  SpO2: 100%   Vitals:   07/07/17 1152   Weight: 86 lb 9.6 oz (39.3 kg)  Height: 4\' 10"  (1.473 m)   Body mass index is 18.1 kg/m. Ideal Body Weight: Weight in (lb) to have BMI = 25: 119.4  GEN: WDWN, NAD, Non-toxic, A & O x 3, tiny build HEENT: Atraumatic, Normocephalic. Neck supple. No masses, No LAD. Bilateral TM wnl, oropharynx normal.  PEERL,EOMI.   Ears and Nose: No external deformity. CV: RRR, No M/G/R. No JVD. No thrill. No extra heart sounds. PULM: CTA B, no wheezes, crackles, rhonchi. No retractions. No resp. distress. No accessory muscle use. EXTR: No c/c/e NEURO Normal gait.  PSYCH: Normally interactive. Conversant. Not depressed or anxious appearing.  Calm demeanor.    Assessment and Plan: Essential hypertension - Plan: CBC, Comprehensive metabolic panel  Dyslipidemia - Plan: Lipid panel  Age-related osteoporosis without current pathological fracture - Plan: DG Bone Density  Underweight  Benign essential HTN - Plan: lisinopril (PRINIVIL,ZESTRIL) 10 MG tablet  Screening for breast cancer - Plan: MM 3D SCREEN BREAST BILATERAL  Following up today Labs pending as above Her BP is controlled; DBP is slightly low, but SBP is ok to slightly high  She denies any sx of hypotension so will continue current regimen Ordered dexa and mammo for her to have done this fall Will plan further follow- up pending labs.   Signed Abbe AmsterdamJessica Leshawn Houseworth, MD  Received her labs Colonoscopy 05/2010 Results for orders placed or performed in visit on 07/07/17  CBC  Result Value Ref Range   WBC 4.9 4.0 - 10.5 K/uL   RBC 3.59 (L) 3.87 - 5.11 Mil/uL   Platelets 279.0 150.0 - 400.0 K/uL   Hemoglobin 11.0 (L) 12.0 - 15.0 g/dL   HCT 09.832.0 (L) 11.936.0 - 14.746.0 %   MCV 89.1 78.0 - 100.0 fl   MCHC 34.4 30.0 - 36.0 g/dL   RDW 82.913.7 56.211.5 - 13.015.5 %  Comprehensive metabolic panel  Result Value Ref Range   Sodium 134 (L) 135 - 145 mEq/L   Potassium 4.9 3.5 - 5.1 mEq/L   Chloride 101 96 - 112 mEq/L   CO2 27 19 - 32 mEq/L   Glucose, Bld 100  (H) 70 - 99 mg/dL   BUN 13 6 - 23 mg/dL   Creatinine, Ser 8.650.58 0.40 - 1.20 mg/dL   Total Bilirubin 0.4 0.2 - 1.2 mg/dL   Alkaline Phosphatase 61 39 - 117 U/L   AST 16 0 - 37 U/L  ALT 12 0 - 35 U/L   Total Protein 6.1 6.0 - 8.3 g/dL   Albumin 4.0 3.5 - 5.2 g/dL   Calcium 9.2 8.4 - 91.4 mg/dL   GFR 782.95 >62.13 mL/min  Lipid panel  Result Value Ref Range   Cholesterol 206 (H) 0 - 200 mg/dL   Triglycerides 08.6 0.0 - 149.0 mg/dL   HDL 57.84 >69.62 mg/dL   VLDL 95.2 0.0 - 84.1 mg/dL   LDL Cholesterol 324 (H) 0 - 99 mg/dL   Total CHOL/HDL Ratio 3    NonHDL 133.68    Letter to pt

## 2017-07-07 ENCOUNTER — Ambulatory Visit: Payer: Medicare Other | Admitting: *Deleted

## 2017-07-07 ENCOUNTER — Ambulatory Visit (INDEPENDENT_AMBULATORY_CARE_PROVIDER_SITE_OTHER): Payer: Medicare Other | Admitting: Family Medicine

## 2017-07-07 ENCOUNTER — Encounter: Payer: Self-pay | Admitting: Family Medicine

## 2017-07-07 VITALS — BP 118/60 | HR 68 | Resp 16 | Ht <= 58 in | Wt 86.6 lb

## 2017-07-07 DIAGNOSIS — Z1231 Encounter for screening mammogram for malignant neoplasm of breast: Secondary | ICD-10-CM

## 2017-07-07 DIAGNOSIS — I10 Essential (primary) hypertension: Secondary | ICD-10-CM | POA: Diagnosis not present

## 2017-07-07 DIAGNOSIS — R636 Underweight: Secondary | ICD-10-CM | POA: Diagnosis not present

## 2017-07-07 DIAGNOSIS — M810 Age-related osteoporosis without current pathological fracture: Secondary | ICD-10-CM | POA: Diagnosis not present

## 2017-07-07 DIAGNOSIS — E785 Hyperlipidemia, unspecified: Secondary | ICD-10-CM

## 2017-07-07 DIAGNOSIS — Z1239 Encounter for other screening for malignant neoplasm of breast: Secondary | ICD-10-CM

## 2017-07-07 LAB — CBC
HEMATOCRIT: 32 % — AB (ref 36.0–46.0)
Hemoglobin: 11 g/dL — ABNORMAL LOW (ref 12.0–15.0)
MCHC: 34.4 g/dL (ref 30.0–36.0)
MCV: 89.1 fl (ref 78.0–100.0)
Platelets: 279 10*3/uL (ref 150.0–400.0)
RBC: 3.59 Mil/uL — AB (ref 3.87–5.11)
RDW: 13.7 % (ref 11.5–15.5)
WBC: 4.9 10*3/uL (ref 4.0–10.5)

## 2017-07-07 LAB — COMPREHENSIVE METABOLIC PANEL
ALBUMIN: 4 g/dL (ref 3.5–5.2)
ALK PHOS: 61 U/L (ref 39–117)
ALT: 12 U/L (ref 0–35)
AST: 16 U/L (ref 0–37)
BUN: 13 mg/dL (ref 6–23)
CALCIUM: 9.2 mg/dL (ref 8.4–10.5)
CHLORIDE: 101 meq/L (ref 96–112)
CO2: 27 mEq/L (ref 19–32)
Creatinine, Ser: 0.58 mg/dL (ref 0.40–1.20)
GFR: 107.3 mL/min (ref >60.00)
Glucose, Bld: 100 mg/dL — ABNORMAL HIGH (ref 70–99)
POTASSIUM: 4.9 meq/L (ref 3.5–5.1)
SODIUM: 134 meq/L — AB (ref 135–145)
TOTAL PROTEIN: 6.1 g/dL (ref 6.0–8.3)
Total Bilirubin: 0.4 mg/dL (ref 0.2–1.2)

## 2017-07-07 LAB — LIPID PANEL
CHOLESTEROL: 206 mg/dL — AB (ref 0–200)
HDL: 72.8 mg/dL (ref >39.00)
LDL CALC: 122 mg/dL — AB (ref 0–99)
NonHDL: 133.68
Total CHOL/HDL Ratio: 3
Triglycerides: 60 mg/dL (ref 0.0–149.0)
VLDL: 12 mg/dL (ref 0.0–40.0)

## 2017-07-07 MED ORDER — LISINOPRIL 10 MG PO TABS: 10.0000 mg | ORAL_TABLET | Freq: Every day | ORAL | 3 refills | Status: DC

## 2017-07-07 MED ORDER — ALENDRONATE SODIUM 70 MG PO TABS: ORAL_TABLET | ORAL | 3 refills | Status: DC

## 2017-07-07 NOTE — Patient Instructions (Signed)
It was nice to see you today!  Take care and let's plan to visit in 6 months I will order a bone density and mammogram for you for this November I will be in touch with your labs asap  Please try to gain a few lbs!

## 2017-07-08 ENCOUNTER — Encounter: Payer: Self-pay | Admitting: *Deleted

## 2017-07-08 ENCOUNTER — Ambulatory Visit (INDEPENDENT_AMBULATORY_CARE_PROVIDER_SITE_OTHER): Payer: Medicare Other | Admitting: *Deleted

## 2017-07-08 VITALS — BP 142/60 | HR 66 | Ht <= 58 in | Wt 86.8 lb

## 2017-07-08 DIAGNOSIS — Z0000 Encounter for general adult medical examination without abnormal findings: Secondary | ICD-10-CM | POA: Diagnosis not present

## 2017-07-08 NOTE — Patient Instructions (Signed)
Please schedule your next medicare wellness visit with me in 1 yr.  Continue to eat heart healthy diet (full of fruits, vegetables, whole grains, lean protein, water--limit salt, fat, and sugar intake) and increase physical activity as tolerated.  Continue doing brain stimulating activities (puzzles, reading, adult coloring books, staying active) to keep memory sharp.    Kristin Hamilton , Thank you for taking time to come for your Medicare Wellness Visit. I appreciate your ongoing commitment to your health goals. Please review the following plan we discussed and let me know if I can assist you in the future.   These are the goals we discussed: Goals    . Do weight bearing exercises.       This is a list of the screening recommended for you and due dates:  Health Maintenance  Topic Date Due  . Flu Shot  08/26/2017  . Tetanus Vaccine  04/19/2023  . DEXA scan (bone density measurement)  Completed  . Pneumonia vaccines  Completed    Health Maintenance for Postmenopausal Women Menopause is a normal process in which your reproductive ability comes to an end. This process happens gradually over a span of months to years, usually between the ages of 42 and 37. Menopause is complete when you have missed 12 consecutive menstrual periods. It is important to talk with your health care provider about some of the most common conditions that affect postmenopausal women, such as heart disease, cancer, and bone loss (osteoporosis). Adopting a healthy lifestyle and getting preventive care can help to promote your health and wellness. Those actions can also lower your chances of developing some of these common conditions. What should I know about menopause? During menopause, you may experience a number of symptoms, such as:  Moderate-to-severe hot flashes.  Night sweats.  Decrease in sex drive.  Mood swings.  Headaches.  Tiredness.  Irritability.  Memory problems.  Insomnia.  Choosing to  treat or not to treat menopausal changes is an individual decision that you make with your health care provider. What should I know about hormone replacement therapy and supplements? Hormone therapy products are effective for treating symptoms that are associated with menopause, such as hot flashes and night sweats. Hormone replacement carries certain risks, especially as you become older. If you are thinking about using estrogen or estrogen with progestin treatments, discuss the benefits and risks with your health care provider. What should I know about heart disease and stroke? Heart disease, heart attack, and stroke become more likely as you age. This may be due, in part, to the hormonal changes that your body experiences during menopause. These can affect how your body processes dietary fats, triglycerides, and cholesterol. Heart attack and stroke are both medical emergencies. There are many things that you can do to help prevent heart disease and stroke:  Have your blood pressure checked at least every 1-2 years. High blood pressure causes heart disease and increases the risk of stroke.  If you are 110-36 years old, ask your health care provider if you should take aspirin to prevent a heart attack or a stroke.  Do not use any tobacco products, including cigarettes, chewing tobacco, or electronic cigarettes. If you need help quitting, ask your health care provider.  It is important to eat a healthy diet and maintain a healthy weight. ? Be sure to include plenty of vegetables, fruits, low-fat dairy products, and lean protein. ? Avoid eating foods that are high in solid fats, added sugars, or salt (sodium).  Get regular exercise. This is one of the most important things that you can do for your health. ? Try to exercise for at least 150 minutes each week. The type of exercise that you do should increase your heart rate and make you sweat. This is known as moderate-intensity exercise. ? Try to do  strengthening exercises at least twice each week. Do these in addition to the moderate-intensity exercise.  Know your numbers.Ask your health care provider to check your cholesterol and your blood glucose. Continue to have your blood tested as directed by your health care provider.  What should I know about cancer screening? There are several types of cancer. Take the following steps to reduce your risk and to catch any cancer development as early as possible. Breast Cancer  Practice breast self-awareness. ? This means understanding how your breasts normally appear and feel. ? It also means doing regular breast self-exams. Let your health care provider know about any changes, no matter how small.  If you are 71 or older, have a clinician do a breast exam (clinical breast exam or CBE) every year. Depending on your age, family history, and medical history, it may be recommended that you also have a yearly breast X-ray (mammogram).  If you have a family history of breast cancer, talk with your health care provider about genetic screening.  If you are at high risk for breast cancer, talk with your health care provider about having an MRI and a mammogram every year.  Breast cancer (BRCA) gene test is recommended for women who have family members with BRCA-related cancers. Results of the assessment will determine the need for genetic counseling and BRCA1 and for BRCA2 testing. BRCA-related cancers include these types: ? Breast. This occurs in males or females. ? Ovarian. ? Tubal. This may also be called fallopian tube cancer. ? Cancer of the abdominal or pelvic lining (peritoneal cancer). ? Prostate. ? Pancreatic.  Cervical, Uterine, and Ovarian Cancer Your health care provider may recommend that you be screened regularly for cancer of the pelvic organs. These include your ovaries, uterus, and vagina. This screening involves a pelvic exam, which includes checking for microscopic changes to the  surface of your cervix (Pap test).  For women ages 21-65, health care providers may recommend a pelvic exam and a Pap test every three years. For women ages 71-65, they may recommend the Pap test and pelvic exam, combined with testing for human papilloma virus (HPV), every five years. Some types of HPV increase your risk of cervical cancer. Testing for HPV may also be done on women of any age who have unclear Pap test results.  Other health care providers may not recommend any screening for nonpregnant women who are considered low risk for pelvic cancer and have no symptoms. Ask your health care provider if a screening pelvic exam is right for you.  If you have had past treatment for cervical cancer or a condition that could lead to cancer, you need Pap tests and screening for cancer for at least 20 years after your treatment. If Pap tests have been discontinued for you, your risk factors (such as having a new sexual partner) need to be reassessed to determine if you should start having screenings again. Some women have medical problems that increase the chance of getting cervical cancer. In these cases, your health care provider may recommend that you have screening and Pap tests more often.  If you have a family history of uterine cancer or ovarian  cancer, talk with your health care provider about genetic screening.  If you have vaginal bleeding after reaching menopause, tell your health care provider.  There are currently no reliable tests available to screen for ovarian cancer.  Lung Cancer Lung cancer screening is recommended for adults 84-35 years old who are at high risk for lung cancer because of a history of smoking. A yearly low-dose CT scan of the lungs is recommended if you:  Currently smoke.  Have a history of at least 30 pack-years of smoking and you currently smoke or have quit within the past 15 years. A pack-year is smoking an average of one pack of cigarettes per day for one  year.  Yearly screening should:  Continue until it has been 15 years since you quit.  Stop if you develop a health problem that would prevent you from having lung cancer treatment.  Colorectal Cancer  This type of cancer can be detected and can often be prevented.  Routine colorectal cancer screening usually begins at age 35 and continues through age 36.  If you have risk factors for colon cancer, your health care provider may recommend that you be screened at an earlier age.  If you have a family history of colorectal cancer, talk with your health care provider about genetic screening.  Your health care provider may also recommend using home test kits to check for hidden blood in your stool.  A small camera at the end of a tube can be used to examine your colon directly (sigmoidoscopy or colonoscopy). This is done to check for the earliest forms of colorectal cancer.  Direct examination of the colon should be repeated every 5-10 years until age 76. However, if early forms of precancerous polyps or small growths are found or if you have a family history or genetic risk for colorectal cancer, you may need to be screened more often.  Skin Cancer  Check your skin from head to toe regularly.  Monitor any moles. Be sure to tell your health care provider: ? About any new moles or changes in moles, especially if there is a change in a mole's shape or color. ? If you have a mole that is larger than the size of a pencil eraser.  If any of your family members has a history of skin cancer, especially at a young age, talk with your health care provider about genetic screening.  Always use sunscreen. Apply sunscreen liberally and repeatedly throughout the day.  Whenever you are outside, protect yourself by wearing long sleeves, pants, a wide-brimmed hat, and sunglasses.  What should I know about osteoporosis? Osteoporosis is a condition in which bone destruction happens more quickly than  new bone creation. After menopause, you may be at an increased risk for osteoporosis. To help prevent osteoporosis or the bone fractures that can happen because of osteoporosis, the following is recommended:  If you are 38-48 years old, get at least 1,000 mg of calcium and at least 600 mg of vitamin D per day.  If you are older than age 7 but younger than age 17, get at least 1,200 mg of calcium and at least 600 mg of vitamin D per day.  If you are older than age 38, get at least 1,200 mg of calcium and at least 800 mg of vitamin D per day.  Smoking and excessive alcohol intake increase the risk of osteoporosis. Eat foods that are rich in calcium and vitamin D, and do weight-bearing exercises several times  each week as directed by your health care provider. What should I know about how menopause affects my mental health? Depression may occur at any age, but it is more common as you become older. Common symptoms of depression include:  Low or sad mood.  Changes in sleep patterns.  Changes in appetite or eating patterns.  Feeling an overall lack of motivation or enjoyment of activities that you previously enjoyed.  Frequent crying spells.  Talk with your health care provider if you think that you are experiencing depression. What should I know about immunizations? It is important that you get and maintain your immunizations. These include:  Tetanus, diphtheria, and pertussis (Tdap) booster vaccine.  Influenza every year before the flu season begins.  Pneumonia vaccine.  Shingles vaccine.  Your health care provider may also recommend other immunizations. This information is not intended to replace advice given to you by your health care provider. Make sure you discuss any questions you have with your health care provider. Document Released: 03/06/2005 Document Revised: 08/02/2015 Document Reviewed: 10/16/2014 Elsevier Interactive Patient Education  2018 [employer].

## 2017-07-19 ENCOUNTER — Ambulatory Visit (HOSPITAL_BASED_OUTPATIENT_CLINIC_OR_DEPARTMENT_OTHER)
Admission: RE | Admit: 2017-07-19 | Discharge: 2017-07-19 | Disposition: A | Discharge status: Home / Self Care | Payer: Medicare Other | Source: Ambulatory Visit | Attending: Family Medicine | Admitting: Family Medicine

## 2017-07-19 DIAGNOSIS — Z1231 Encounter for screening mammogram for malignant neoplasm of breast: Secondary | ICD-10-CM | POA: Insufficient documentation

## 2017-07-19 DIAGNOSIS — Z1239 Encounter for other screening for malignant neoplasm of breast: Secondary | ICD-10-CM

## 2017-09-28 ENCOUNTER — Other Ambulatory Visit: Payer: Self-pay | Admitting: Family Medicine

## 2018-01-04 NOTE — Progress Notes (Addendum)
[facility] [address], [address] [place], [place] [address] [phone] [phone]  Date:  01/06/2018   Name:  Kristin PiliJudy C Kilian   DOB:  30-May-1940   MRN:  [id number]  PCP:  Pearline Cablesopland, Sefora Tietje C, MD    Chief Complaint: Hypertension (6 month follow up, no concerns)   History of Present Illness:  Kristin Hamilton is a 77 y.o. very pleasant female patient who presents with the following:  Periodic follow-up visit today.  History of hypertension, anxiety, osteoporosis. Last seen here in June.  At that time she was working on her weight had lost couple of pounds.  She lives with 1 of her sisters, and they enjoy walking her dog together. We did lab work in June.  She states that nothing is new, no concerns She plans to stay local for the holidays.  Her other sister lives in [place] and will visit   I have noted mild anemia recently, and her colonoscopy is several years old.  Discussed with patient today. Colon in 2012  shinrix- discuessed with pt   Wt Readings from Last 3 Encounters:  01/06/18 85 lb (38.6 kg)  07/08/17 86 lb 12.8 oz (39.4 kg)  07/07/17 86 lb 9.6 oz (39.3 kg)     Patient Active Problem List   Diagnosis Date Noted  . Osteoporosis 11/29/2013  . History of wrist fracture 04/18/2013  . Seborrheic keratosis 12/03/2011  . HTN (hypertension) 08/06/2011  . Anxiety 08/06/2011  . GERD (gastroesophageal reflux disease) 08/06/2011  . History of tobacco use 08/06/2011    Past Medical History:  Diagnosis Date  . Anemia   . Arthritis   . Cataract   . Chicken pox   . Constipation   . Depression   . GERD (gastroesophageal reflux disease)   . Hay fever   . Hypertension   . Osteoporosis     Past Surgical History:  Procedure Laterality Date  . WISDOM TOOTH EXTRACTION      Social History   Tobacco Use  . Smoking status: Former Games developermoker  . Smokeless tobacco: Never Used  Substance Use Topics  . Alcohol use: No  . Drug  use: No    Family History  Problem Relation Age of Onset  . Cancer Father [id number]       pancreatic cancer  . Hypertension Father   . Breast cancer Sister   . Hypertension Sister   . Osteoporosis Sister   . Hyperlipidemia Sister   . Stroke Sister   . Ovarian cancer Sister   . Osteoporosis Sister   . Hypertension Brother   . Stroke Mother   . Heart disease Mother   . Hypertension Mother   . Heart disease Maternal Grandmother   . Stroke Maternal Grandfather   . Stroke Paternal Grandfather     Allergies  Allergen Reactions  . Codeine Nausea Only    Medication list has been reviewed and updated.  Current Outpatient Medications on File Prior to Visit  Medication Sig Dispense Refill  . alendronate (FOSAMAX) 70 MG tablet TAKE 1 TABLET BY MOUTH EVERY 7 DAYS. TAKE WITH A FULL GLASS OF WATER ON AN EMPTY STOMACH 12 tablet 3  . alendronate (FOSAMAX) 70 MG tablet TAKE 1 TABLET BY MOUTH EVERY 7 DAYS. TAKE WITH A FULL GLASS OF WATER ON AN EMPTY STOMACH 12 tablet 2  . lisinopril (PRINIVIL,ZESTRIL) 10 MG tablet Take 1 tablet (10 mg total) by mouth daily. 90 tablet 3  .  OVER THE COUNTER MEDICATION OTC [facility] eye drops for allergies prn    . Polyethyl Glycol-Propyl Glycol (SYSTANE OP) Apply 1 drop to eye 2 (two) times daily as needed.       No current facility-administered medications on file prior to visit.     Review of Systems:  As per HPI- otherwise negative. No CP or SOB   Physical Examination: Vitals:   01/06/18 1052  BP: (!) 150/78  Pulse: 68  Resp: 16  Temp: 98.4 F (36.9 C)  SpO2: 98%   Vitals:   01/06/18 1052  Weight: 85 lb (38.6 kg)  Height: 4\' 10"  (1.473 m)   Body mass index is 17.77 kg/m. Ideal Body Weight: Weight in (lb) to have BMI = 25: 119.4  GEN: WDWN, NAD, Non-toxic, A & O x 3, looks well, underweight. HEENT: Atraumatic, Normocephalic. Neck supple. No masses, No LAD. Ears and Nose: No external deformity. CV: RRR, No M/G/R. No JVD. No thrill. No extra  heart sounds. PULM: CTA B, no wheezes, crackles, rhonchi. No retractions. No resp. distress. No accessory muscle use. EXTR: No c/c/e NEURO Normal gait.  PSYCH: Normally interactive. Conversant. Not depressed or anxious appearing.  Calm demeanor. '  Assessment and Plan: Benign essential HTN - Plan: CBC, Basic metabolic panel  Mild anemia - Plan: Ferritin, Fecal occult blood, imunochemical([facility])  Underweight  Age-related osteoporosis without current pathological fracture - Plan: DG Bone Density  Periodic follow-up visit today.  Keidra is feeling well.  Labs plan as above, will be in touch with results. She is on Fosamax for osteoporosis, it is time to update her bone density scan.  Order this for her today.  If not improving, we may need to change her to a different medication for her bones.    She is still underweight, though her weight is stable.  I continue to encourage her to try and gain weight.  Noted mild anemia recently, colonoscopy was 7 years ago.  We will have her complete stool cards.  Signed Abbe Amsterdam, MD  Received her labs, message to pt  Results for orders placed or performed in visit on 01/06/18  CBC  Result Value Ref Range   WBC 4.2 4.0 - 10.5 K/uL   RBC 3.77 (L) 3.87 - 5.11 Mil/uL   Platelets 283.0 150.0 - 400.0 K/uL   Hemoglobin 11.4 (L) 12.0 - 15.0 g/dL   HCT 60.4 (L) 54.0 - 98.1 %   MCV 90.6 78.0 - 100.0 fl   MCHC 33.3 30.0 - 36.0 g/dL   RDW 19.1 47.8 - 29.5 %  Basic metabolic panel  Result Value Ref Range   Sodium 134 (L) 135 - 145 mEq/L   Potassium 4.6 3.5 - 5.1 mEq/L   Chloride 101 96 - 112 mEq/L   CO2 26 19 - 32 mEq/L   Glucose, Bld 96 70 - 99 mg/dL   BUN 12 6 - 23 mg/dL   Creatinine, Ser 6.21 0.40 - 1.20 mg/dL   Calcium 8.7 8.4 - 30.8 mg/dL   GFR 65.78 >46.96 mL/min  Ferritin  Result Value Ref Range   Ferritin 21.0 10.0 - 291.0 ng/mL

## 2018-01-06 ENCOUNTER — Encounter: Payer: Self-pay | Admitting: Family Medicine

## 2018-01-06 ENCOUNTER — Ambulatory Visit (INDEPENDENT_AMBULATORY_CARE_PROVIDER_SITE_OTHER): Payer: Medicare Other | Admitting: Family Medicine

## 2018-01-06 VITALS — BP 142/70 | HR 68 | Temp 98.4°F | Resp 16 | Ht <= 58 in | Wt 85.0 lb

## 2018-01-06 DIAGNOSIS — M810 Age-related osteoporosis without current pathological fracture: Secondary | ICD-10-CM | POA: Diagnosis not present

## 2018-01-06 DIAGNOSIS — D649 Anemia, unspecified: Secondary | ICD-10-CM

## 2018-01-06 DIAGNOSIS — R636 Underweight: Secondary | ICD-10-CM

## 2018-01-06 DIAGNOSIS — I10 Essential (primary) hypertension: Secondary | ICD-10-CM

## 2018-01-06 LAB — CBC
HEMATOCRIT: 34.2 % — AB (ref 36.0–46.0)
Hemoglobin: 11.4 g/dL — ABNORMAL LOW (ref 12.0–15.0)
MCHC: 33.3 g/dL (ref 30.0–36.0)
MCV: 90.6 fl (ref 78.0–100.0)
PLATELETS: 283 10*3/uL (ref 150.0–400.0)
RBC: 3.77 Mil/uL — AB (ref 3.87–5.11)
RDW: 13.4 % (ref 11.5–15.5)
WBC: 4.2 10*3/uL (ref 4.0–10.5)

## 2018-01-06 LAB — BASIC METABOLIC PANEL
BUN: 12 mg/dL (ref 6–23)
CHLORIDE: 101 meq/L (ref 96–112)
CO2: 26 meq/L (ref 19–32)
Calcium: 8.7 mg/dL (ref 8.4–10.5)
Creatinine, Ser: 0.64 mg/dL (ref 0.40–1.20)
GFR: 95.65 mL/min (ref >60.00)
Glucose, Bld: 96 mg/dL (ref 70–99)
Potassium: 4.6 mEq/L (ref 3.5–5.1)
Sodium: 134 mEq/L — ABNORMAL LOW (ref 135–145)

## 2018-01-06 LAB — FERRITIN: Ferritin: 21 ng/mL (ref 10.0–291.0)

## 2018-01-06 NOTE — Patient Instructions (Signed)
It was good to see you today as always.  I will be in touch with your labs ASAP. We are going to set you up for a bone density exam.  If you are not showing improvement, we may change you to a different medication for your bones.  I also noticed you are mildly anemic.  Please complete the stool test cards at home, and send them back to us.  We will make sure you are not losing any blood in your stool. You are underweight.  Please continue to work on eating, with the goal of gaining weight. Otherwise please see me in 6 months, and have a wonderful holiday season.

## 2018-01-17 ENCOUNTER — Other Ambulatory Visit (INDEPENDENT_AMBULATORY_CARE_PROVIDER_SITE_OTHER): Payer: Medicare Other

## 2018-01-17 ENCOUNTER — Telehealth: Payer: Self-pay | Admitting: Family Medicine

## 2018-01-17 DIAGNOSIS — D649 Anemia, unspecified: Secondary | ICD-10-CM

## 2018-01-17 LAB — FECAL OCCULT BLOOD, IMMUNOCHEMICAL: FECAL OCCULT BLD: POSITIVE — AB

## 2018-01-17 NOTE — Telephone Encounter (Signed)
Call patient, her stool was positive for blood Her last colonoscopy was in 2012, and she is mildly anemic.  She is really not enthusiastic about doing a colonoscopy.  I explained to her that certainly no one will force her to do a colonoscopy, but I do want her to talk to GI so she understands all of her options.  It is possible that Cologuard may be a viable option for her. I will go ahead and refer her to our gastroenterologist at the med center for a consultation

## 2018-02-11 ENCOUNTER — Encounter: Payer: Self-pay | Admitting: Internal Medicine

## 2018-02-11 ENCOUNTER — Ambulatory Visit (INDEPENDENT_AMBULATORY_CARE_PROVIDER_SITE_OTHER): Payer: Medicare Other | Admitting: Internal Medicine

## 2018-02-11 VITALS — BP 140/70 | HR 73 | Ht <= 58 in | Wt 85.4 lb

## 2018-02-11 DIAGNOSIS — K5520 Angiodysplasia of colon without hemorrhage: Secondary | ICD-10-CM | POA: Diagnosis not present

## 2018-02-11 DIAGNOSIS — R195 Other fecal abnormalities: Secondary | ICD-10-CM

## 2018-02-11 DIAGNOSIS — D649 Anemia, unspecified: Secondary | ICD-10-CM

## 2018-02-11 MED ORDER — NA SULFATE-K SULFATE-MG SULF 17.5-3.13-1.6 GM/177ML PO SOLN: 1.0000 | Freq: Once | ORAL | 0 refills | Status: AC

## 2018-02-11 NOTE — Patient Instructions (Signed)

## 2018-02-11 NOTE — Progress Notes (Signed)
HISTORY OF PRESENT ILLNESS:  Kristin Hamilton is a 78 y.o. female who is referred today by her primary provider Dr. Patsy Lager for evaluation of anemia and Hemoccult-positive stool.  The patient was last seen in this office January 16, 2016 regarding normocytic anemia which was mild and unchanged over the previous 6 years.  No GI complaints at that time and her stool was Hemoccult negative.  No evidence for iron deficiency.  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  Anemia panel was normal.  No additional work-up recommended were pursued.  She underwent recent evaluation.  Again found to have persistent stable unchanged mild anemia with hemoglobin 11.4.  MCV 90.6.  Ferritin normal at 21.  However, Hemoccult studies reveal positive stool.  Patient's GI review of systems is entirely negative except for occasional constipation and bloating.  Her weight is unchanged from previous visit.  She denies NSAID use.  Review of x-ray file reveals no relevant abnormalities  REVIEW OF SYSTEMS:  All non-GI ROS negative unless otherwise stated in the HPI except for sinus and allergy, arthritis, back pain, nosebleeds  Past Medical History:  Diagnosis Date  . Anemia   . Arthritis   . Cataract   . Chicken pox   . Constipation   . Depression   . GERD (gastroesophageal reflux disease)   . Hay fever   . Hypertension   . Osteoporosis     Past Surgical History:  Procedure Laterality Date  . WISDOM TOOTH EXTRACTION      Social History Kristin Hamilton  reports that she has quit smoking. She has never used smokeless tobacco. She reports that she does not drink alcohol or use drugs.  family history includes Breast cancer in her sister; Cancer (age of onset: 54) in her father; Heart disease in her maternal grandmother and mother; Hyperlipidemia in her sister; Hypertension in her brother, father, mother, and sister; Osteoporosis in her sister and sister; Ovarian cancer in her sister; Stroke in her maternal  grandfather, mother, paternal grandfather, and sister.  Allergies  Allergen Reactions  . Codeine Nausea Only       PHYSICAL EXAMINATION: Vital signs: BP 140/70   Pulse 73   Ht 4' 9.5" (1.461 m)   Wt 85 lb 6.4 oz (38.7 kg)   BMI 18.16 kg/m   Constitutional: Thin but generally well-appearing, no acute distress Psychiatric: alert and oriented x3, cooperative Eyes: extraocular movements intact, anicteric, conjunctiva pink Mouth: oral pharynx moist, no lesions Neck: supple no lymphadenopathy Cardiovascular: heart regular rate and rhythm, no murmur Lungs: clear to auscultation bilaterally Abdomen: soft, nontender, nondistended, no obvious ascites, no peritoneal signs, normal bowel sounds, no organomegaly Rectal: Deferred until colonoscopy Extremities: no clubbing, cyanosis, or lower extremity edema bilaterally Skin: no lesions on visible extremities Neuro: No focal deficits. No asterixis.    ASSESSMENT:  1.  Hemoccult positive stool 2.  Stable mild normocytic anemia without evidence of iron deficiency 3.  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  Possible cause for Hemoccult positive stool   PLAN:  1.  Schedule colonoscopy and upper endoscopy to evaluate Hemoccult-positive stool.The nature of the procedure, as well as the risks, benefits, and alternatives were carefully and thoroughly reviewed with the patient. Ample time for discussion and questions allowed. The patient understood, was satisfied, and agreed to proceed. 2.  Further recommendations, if any, after the above

## 2018-02-24 ENCOUNTER — Ambulatory Visit (AMBULATORY_SURGERY_CENTER): Payer: Medicare Other | Admitting: Internal Medicine

## 2018-02-24 ENCOUNTER — Encounter: Payer: Self-pay | Admitting: Internal Medicine

## 2018-02-24 VITALS — BP 129/63 | HR 73 | Temp 99.0°F | Resp 15 | Ht <= 58 in | Wt 85.0 lb

## 2018-02-24 DIAGNOSIS — K222 Esophageal obstruction: Secondary | ICD-10-CM

## 2018-02-24 DIAGNOSIS — D649 Anemia, unspecified: Secondary | ICD-10-CM

## 2018-02-24 DIAGNOSIS — K5730 Diverticulosis of large intestine without perforation or abscess without bleeding: Secondary | ICD-10-CM

## 2018-02-24 DIAGNOSIS — K2100 Gastro-esophageal reflux disease with esophagitis, without bleeding: Secondary | ICD-10-CM

## 2018-02-24 DIAGNOSIS — K449 Diaphragmatic hernia without obstruction or gangrene: Secondary | ICD-10-CM | POA: Diagnosis not present

## 2018-02-24 DIAGNOSIS — K5520 Angiodysplasia of colon without hemorrhage: Secondary | ICD-10-CM | POA: Diagnosis not present

## 2018-02-24 DIAGNOSIS — R195 Other fecal abnormalities: Secondary | ICD-10-CM

## 2018-02-24 MED ORDER — SODIUM CHLORIDE 0.9 % IV SOLN: 500.0000 mL | Freq: Once | INTRAVENOUS | Status: DC

## 2018-02-24 MED ORDER — OMEPRAZOLE 20 MG PO CPDR: 20.0000 mg | DELAYED_RELEASE_CAPSULE | Freq: Every day | ORAL | 11 refills | Status: AC

## 2018-02-24 NOTE — Progress Notes (Signed)
Pt sleepy. Report given to RN. VSS. No anesthetic complications noted

## 2018-02-24 NOTE — Patient Instructions (Signed)
YOU HAD AN ENDOSCOPIC PROCEDURE TODAY AT THE [facility]:   Refer to the procedure report that was given to you for any specific questions about what was found during the examination.  If the procedure report does not answer your questions, please call your gastroenterologist to clarify.  If you requested that your care partner not be given the details of your procedure findings, then the procedure report has been included in a sealed envelope for you to review at your convenience later.  YOU SHOULD EXPECT: Some feelings of bloating in the abdomen. Passage of more gas than usual.  Walking can help get rid of the air that was put into your GI tract during the procedure and reduce the bloating. If you had a lower endoscopy (such as a colonoscopy or flexible sigmoidoscopy) you may notice spotting of blood in your stool or on the toilet paper. If you underwent a bowel prep for your procedure, you may not have a normal bowel movement for a few days.  Please Note:  You might notice some irritation and congestion in your nose or some drainage.  This is from the oxygen used during your procedure.  There is no need for concern and it should clear up in a day or so.  SYMPTOMS TO REPORT IMMEDIATELY:   Following lower endoscopy (colonoscopy or flexible sigmoidoscopy):  Excessive amounts of blood in the stool  Significant tenderness or worsening of abdominal pains  Swelling of the abdomen that is new, acute  Fever of 100F or higher   Following upper endoscopy (EGD)  Vomiting of blood or coffee ground material  New chest pain or pain under the shoulder blades  Painful or persistently difficult swallowing  New shortness of breath  Fever of 100F or higher  Black, tarry-looking stools   Omeprazole 20 mg daily was sent to your pharmacy.  For urgent or emergent issues, a gastroenterologist can be reached at any hour by calling [phone].   DIET:  We do recommend a small meal at first,  but then you may proceed to your regular diet.  Drink plenty of fluids but you should avoid alcoholic beverages for 24 hours.  ACTIVITY:  You should plan to take it easy for the rest of today and you should NOT DRIVE or use heavy machinery until tomorrow (because of the sedation medicines used during the test).    FOLLOW UP: Our staff will call the number listed on your records the next business day following your procedure to check on you and address any questions or concerns that you may have regarding the information given to you following your procedure. If we do not reach you, we will leave a message.  However, if you are feeling well and you are not experiencing any problems, there is no need to return our call.  We will assume that you have returned to your regular daily activities without incident.  If any biopsies were taken you will be contacted by phone or by letter within the next 1-3 weeks.  Please call us at [phone] if you have not heard about the biopsies in 3 weeks.    SIGNATURES/CONFIDENTIALITY: You and/or your care partner have signed paperwork which will be entered into your electronic medical record.  These signatures attest to the fact that that the information above on your After Visit Summary has been reviewed and is understood.  Full responsibility of the confidentiality of this discharge information lies with you and/or your care-partner.  Thank you for letting us take care of your healthcare needs today.

## 2018-02-24 NOTE — Op Note (Signed)
[facility] Patient Name: Kristin Hamilton Procedure Date: 02/24/2018 2:17 PM MRN: [id number] Endoscopist: Wilhemina Bonito. Marina Goodell , MD Age: 78 Referring MD:  Date of Birth: 11/02/1940 Gender: Female Account #: [id number] Procedure:                Upper GI endoscopy Indications:              Heme positive stool Medicines:                Monitored Anesthesia Care Procedure:                Pre-Anesthesia Assessment:                           - Prior to the procedure, a History and Physical                            was performed, and patient medications and                            allergies were reviewed. The patient's tolerance of                            previous anesthesia was also reviewed. The risks                            and benefits of the procedure and the sedation                            options and risks were discussed with the patient.                            All questions were answered, and informed consent                            was obtained. Prior Anticoagulants: The patient has                            taken no previous anticoagulant or antiplatelet                            agents. ASA Grade Assessment: II - A patient with                            mild systemic disease. After reviewing the risks                            and benefits, the patient was deemed in                            satisfactory condition to undergo the procedure.                           After obtaining informed consent, the endoscope was  passed under direct vision. Throughout the                            procedure, the patient's blood pressure, pulse, and                            oxygen saturations were monitored continuously. The                            Endoscope was introduced through the mouth, and                            advanced to the third part of duodenum. The upper                            GI endoscopy was accomplished without  difficulty.                            The patient tolerated the procedure well. Scope In: Scope Out: Findings:                 LA Grade A (one or more mucosal breaks less than 5                            mm, not extending between tops of 2 mucosal folds)                            esophagitis was found. In addition ringlike                            stricture at the gastroesophageal junction.                           The stomach was normal except for a small sliding                            hiatal hernia.                           The examined duodenum was normal.                           The cardia and gastric fundus were normal on                            retroflexion. Complications:            No immediate complications. Estimated Blood Loss:     Estimated blood loss: none. Impression:               1. Reflux esophagitis                           2. Esophageal stricture                           3.  Otherwise normal EGD. Recommendation:           - Patient has a contact number available for                            emergencies. The signs and symptoms of potential                            delayed complications were discussed with the                            patient. Return to normal activities tomorrow.                            Written discharge instructions were provided to the                            patient.                           - Resume previous diet.                           - Continue present medications.                           - Prescribe omeprazole 20 mg daily; #30; 11                            refills. This medication will heal your esophageal                            inflammation and help protect against esophageal                            complications in the future                           - Resume care with your primary provider. GI                            follow-up as needed Wilhemina Bonito. Marina Goodell, MD 02/24/2018 2:27:05 PM This report has  been signed electronically.

## 2018-02-24 NOTE — Op Note (Signed)
[facility] Patient Name: Kristin Hamilton Procedure Date: 02/24/2018 1:35 PM MRN: [id number] Endoscopist: Wilhemina Bonito. Marina Goodell , MD Age: 78 Referring MD:  Date of Birth: 1940/02/01 Gender: Female Account #: [id number] Procedure:                Colonoscopy Indications:              Heme positive stool and chronic stable normocytic                            anemia Medicines:                Monitored Anesthesia Care Procedure:                Pre-Anesthesia Assessment:                           - Prior to the procedure, a History and Physical                            was performed, and patient medications and                            allergies were reviewed. The patient's tolerance of                            previous anesthesia was also reviewed. The risks                            and benefits of the procedure and the sedation                            options and risks were discussed with the patient.                            All questions were answered, and informed consent                            was obtained. Prior Anticoagulants: The patient has                            taken no previous anticoagulant or antiplatelet                            agents. ASA Grade Assessment: II - A patient with                            mild systemic disease. After reviewing the risks                            and benefits, the patient was deemed in                            satisfactory condition to undergo the procedure.  After obtaining informed consent, the colonoscope                            was passed under direct vision. Throughout the                            procedure, the patient's blood pressure, pulse, and                            oxygen saturations were monitored continuously. The                            Model PCF-H190DL [id number]) scope was introduced                            through the anus and advanced to the the cecum,                        identified by appendiceal orifice and ileocecal                            valve. The ileocecal valve, appendiceal orifice,                            and rectum were photographed. The quality of the                            bowel preparation was excellent. The colonoscopy                            was performed without difficulty. The patient                            tolerated the procedure well. The bowel preparation                            used was SUPREP. Scope In: 2:03:59 PM Scope Out: 2:14:18 PM Scope Withdrawal Time: 0 hours 9 minutes 1 second  Total Procedure Duration: 0 hours 10 minutes 19 seconds  Findings:                 Two small angiodysplastic lesions without bleeding                            were found in the cecum.                           A few medium-mouthed diverticula were found in the                            sigmoid colon.                           The exam was otherwise without abnormality on  direct and retroflexion views. Complications:            No immediate complications. Estimated blood loss:                            None. Estimated Blood Loss:     Estimated blood loss: none. Impression:               - Two non-bleeding colonic angiodysplastic lesions.                            Possible cause for asymptomatic Hemoccult-positive                            stool.                           - Diverticulosis in the sigmoid colon.                           - The examination was otherwise normal on direct                            and retroflexion views.                           - No specimens collected. Recommendation:           - Repeat colonoscopy is not recommended for                            surveillance.                           - Patient has a contact number available for                            emergencies. The signs and symptoms of potential                            delayed  complications were discussed with the                            patient. Return to normal activities tomorrow.                            Written discharge instructions were provided to the                            patient.                           - Resume previous diet.                           - Continue present medications.                           - EGD today.  Please see report Wilhemina Bonito. Marina Goodell, MD 02/24/2018 2:19:06 PM This report has been signed electronically.

## 2018-02-25 ENCOUNTER — Telehealth: Payer: Self-pay

## 2018-02-25 NOTE — Telephone Encounter (Signed)
  Follow up Call-  Call back number 02/24/2018  Post procedure Call Back phone  # [phone]  Permission to leave phone message Yes  Some recent data might be hidden     Patient questions:  Do you have a fever, pain , or abdominal swelling? No. Pain Score  0 *  Have you tolerated food without any problems? Yes.    Have you been able to return to your normal activities? Yes.    Do you have any questions about your discharge instructions: Diet   No. Medications  No. Follow up visit  No.  Do you have questions or concerns about your Care? No.  Actions: * If pain score is 4 or above: No action needed, pain <4.

## 2018-07-08 NOTE — Progress Notes (Deleted)
Virtual Visit via Video Note  I connected with patient on 07/11/18 at 11:00 AM EDT by a video enabled telemedicine application and verified that I am speaking with the correct person using two identifiers.   THIS ENCOUNTER IS A VIRTUAL VISIT DUE TO COVID-19 - PATIENT WAS NOT SEEN IN THE OFFICE. PATIENT HAS CONSENTED TO VIRTUAL VISIT / TELEMEDICINE VISIT   Location of patient: home  Location of provider: office  I discussed the limitations of evaluation and management by telemedicine and the availability of in person appointments. The patient expressed understanding and agreed to proceed.   Subjective:   Kristin Hamilton is a 78 y.o. female who presents for Medicare Annual (Subsequent) preventive examination.  Review of Systems: No ROS.  Medicare Wellness Virtual Visit.  Visual/audio telehealth visit, UTA vital signs.   See social history for additional risk factors.   Sleep patterns:  Home Safety/Smoke Alarms: Feels safe in home. Smoke alarms in place.     Female:     Mammo- 07/19/17      Dexa scan- scheduled 07/11/18       CCS- 06/06/10     Objective:     Vitals: There were no vitals taken for this visit.  There is no height or weight on file to calculate BMI.  Advanced Directives 07/08/2017 07/06/2016  Does Patient Have a Medical Advance Directive? No No  Does patient want to make changes to medical advance directive? Yes (MAU/Ambulatory/Procedural Areas - Information given) -  Would patient like information on creating a medical advance directive? - Yes (MAU/Ambulatory/Procedural Areas - Information given)    Tobacco Social History   Tobacco Use  Smoking Status Former Smoker  Smokeless Tobacco Never Used     Counseling given: Not Answered   Clinical Intake:                       Past Medical History:  Diagnosis Date  . Allergy   . Anemia   . Arthritis   . Cataract   . Chicken pox   . Constipation   . Depression   . GERD (gastroesophageal reflux  disease)   . Hay fever   . Hypertension   . Osteoporosis    Past Surgical History:  Procedure Laterality Date  . WISDOM TOOTH EXTRACTION     Family History  Problem Relation Age of Onset  . Cancer Father 6       pancreatic cancer  . Hypertension Father   . Breast cancer Sister   . Hypertension Sister   . Osteoporosis Sister   . Hyperlipidemia Sister   . Stroke Sister   . Ovarian cancer Sister   . Osteoporosis Sister   . Hypertension Brother   . Stroke Mother   . Heart disease Mother   . Hypertension Mother   . Heart disease Maternal Grandmother   . Stroke Maternal Grandfather   . Stroke Paternal Grandfather   . Colon cancer Neg Hx    Social History   Socioeconomic History  . Marital status: Single    Spouse name: Not on file  . Number of children: Not on file  . Years of education: Not on file  . Highest education level: Not on file  Occupational History  . Occupation: retired    Fish farm manager: [employer]  . Financial resource strain: Not on file  . Food insecurity    Worry: Not on file    Inability: Not on file  .  Transportation needs    Medical: Not on file    Non-medical: Not on file  Tobacco Use  . Smoking status: Former Games developermoker  . Smokeless tobacco: Never Used  Substance and Sexual Activity  . Alcohol use: No  . Drug use: No  . Sexual activity: Never  Lifestyle  . Physical activity    Days per week: Not on file    Minutes per session: Not on file  . Stress: Not on file  Relationships  . Social Musicianconnections    Talks on phone: Not on file    Gets together: Not on file    Attends religious service: Not on file    Active member of club or organization: Not on file    Attends meetings of clubs or organizations: Not on file    Relationship status: Not on file  Other Topics Concern  . Not on file  Social History Narrative  . Not on file    Outpatient Encounter Medications as of 07/11/2018  Medication Sig  . alendronate  (FOSAMAX) 70 MG tablet TAKE 1 TABLET BY MOUTH EVERY 7 DAYS. TAKE WITH A FULL GLASS OF WATER ON AN EMPTY STOMACH  . lisinopril (PRINIVIL,ZESTRIL) 10 MG tablet Take 1 tablet (10 mg total) by mouth daily.  Marland Kitchen. omeprazole (PRILOSEC) 20 MG capsule Take 1 capsule (20 mg total) by mouth daily.  Marland Kitchen. OVER THE COUNTER MEDICATION OTC eye drops for allergies prn   No facility-administered encounter medications on file as of 07/11/2018.     Activities of Daily Living In your present state of health, do you have any difficulty performing the following activities: 07/08/2017  Hearing? N  Vision? N  Difficulty concentrating or making decisions? N  Walking or climbing stairs? N  Dressing or bathing? N  Doing errands, shopping? N  Preparing Food and eating ? N  Using the Toilet? N  In the past six months, have you accidently leaked urine? Y  Comment wear pads  Do you have problems with loss of bowel control? N  Managing your Medications? N  Managing your Finances? N  Housekeeping or managing your Housekeeping? N  Some recent data might be hidden    Patient Care Team: Copland, Gwenlyn FoundJessica C, MD as PCP - General (Family Medicine) Maurice MarchMcPherson, Barbara B, MD (Inactive) (Family Medicine) Iran OuchArida, Muhammad A, MD (Cardiology)    Assessment:   This is a routine wellness examination for [name]. Physical assessment deferred to PCP.  Exercise Activities and Dietary recommendations   Diet (meal preparation, eat out, water intake, caffeinated beverages, dairy products, fruits and vegetables): {Desc; diets:16563} Breakfast: Lunch:  Dinner:      Goals    . Do weight bearing exercises.    . Pt states she would like to eventually live alone again.       Fall Risk Fall Risk  07/08/2017 07/07/2017 07/06/2016 11/13/2015 11/20/2014  Falls in the past year? No No No No No    Depression Screen PHQ 2/9 Scores 07/08/2017 07/07/2017 07/06/2016 11/13/2015  PHQ - 2 Score 0 0 0 0  PHQ- 9 Score - - - -     Cognitive Function  Ad8 score reviewed for issues:  Issues making decisions:  Less interest in hobbies / activities:  Repeats questions, stories (family complaining):  Trouble using ordinary gadgets (microwave, computer, phone):  Forgets the month or year:   Mismanaging finances:   Remembering appts:  Daily problems with thinking and/or memory: Ad8 score is=     MMSE - Mini  Mental State Exam 07/08/2017 07/06/2016  Orientation to time 5 5  Orientation to Place 5 5  Registration 3 3  Attention/ Calculation 5 5  Recall 0 2  Language- name 2 objects 2 2  Language- repeat 1 1  Language- follow 3 step command 3 3  Language- read & follow direction 1 1  Write a sentence 1 1  Copy design 1 1  Total score 27 29        Immunization History  Administered Date(s) Administered  . Influenza, High Dose Seasonal PF 11/13/2015, 11/12/2016  . Influenza,inj,Quad PF,6+ Mos 12/13/2012, 10/10/2013, 10/23/2014  . Influenza-Unspecified 10/29/2011, 11/29/2017  . Pneumococcal Conjugate-13 05/02/2015  . Pneumococcal Polysaccharide-23 12/02/2011  . Td 04/18/2013  . Zoster 02/09/2012    Screening Tests Health Maintenance  Topic Date Due  . INFLUENZA VACCINE  08/27/2018  . TETANUS/TDAP  04/19/2023  . DEXA SCAN  Completed  . PNA vac Low Risk Adult  Completed      Plan:   ***   I have personally reviewed and noted the following in the patient's chart:   . Medical and social history . Use of alcohol, tobacco or illicit drugs  . Current medications and supplements . Functional ability and status . Nutritional status . Physical activity . Advanced directives . List of other physicians . Hospitalizations, surgeries, and ER visits in previous 12 months . Vitals . Screenings to include cognitive, depression, and falls . Referrals and appointments  In addition, I have reviewed and discussed with patient certain preventive protocols, quality metrics, and best practice recommendations. A written  personalized care plan for preventive services as well as general preventive health recommendations were provided to patient.     Avon GullyBritt, Ismelda Weatherman Angel, [place]RN  07/08/2018

## 2018-07-09 NOTE — Progress Notes (Addendum)
[facility] [address], [address] [place], [place] [address] [phone] [phone]  Date:  07/11/2018   Name:  Kristin PiliJudy Hamilton Hamilton   DOB:  17-Sep-1940   MRN:  [id number]  PCP:  Pearline Cablesopland, Margarite Vessel C, MD    Chief Complaint: Hypertension (6 month follow)   History of Present Illness:  Kristin PiliJudy Hamilton Hamilton is a 78 y.o. very pleasant female patient who presents with the following:  6 month recheck for this nice lady with history of HTN, anxiety, GERD, osteoporosis Our last visit was in December She was seen by GI for mild anemia/ positive FOBT and had a colonoscopy in January per Dr. Marina GoodellPerry- looked ok  Shingrix: she declines  Dexa: done today, await report.  She is on fosamax, refilled for her today  Mammo: one year ago-she prefers to have done next year  Labs: December   She does check her BP at home on occasion but not very recently - well controlled on current dose of lisinopril 10 mg   Wt Readings from Last 3 Encounters:  07/11/18 84 lb (38.1 kg)  02/24/18 85 lb (38.6 kg)  02/11/18 85 lb 6.4 oz (38.7 kg)   Weight in 2014 was 89 lbs She has always been very petite, admits that she does not have much of an appetite. She lives with her sister, they enjoyed gardening on their patio  Patient Active Problem List   Diagnosis Date Noted  . Osteoporosis 11/29/2013  . History of wrist fracture 04/18/2013  . Seborrheic keratosis 12/03/2011  . HTN (hypertension) 08/06/2011  . Anxiety 08/06/2011  . GERD (gastroesophageal reflux disease) 08/06/2011  . History of tobacco use 08/06/2011    Past Medical History:  Diagnosis Date  . Allergy   . Anemia   . Arthritis   . Cataract   . Chicken pox   . Constipation   . Depression   . GERD (gastroesophageal reflux disease)   . Hay fever   . Hypertension   . Osteoporosis     Past Surgical History:  Procedure Laterality Date  . WISDOM TOOTH EXTRACTION      Social History   Tobacco Use  .  Smoking status: Former Games developermoker  . Smokeless tobacco: Never Used  Substance Use Topics  . Alcohol use: No  . Drug use: No    Family History  Problem Relation Age of Onset  . Cancer Father 2970       pancreatic cancer  . Hypertension Father   . Breast cancer Sister   . Hypertension Sister   . Osteoporosis Sister   . Hyperlipidemia Sister   . Stroke Sister   . Ovarian cancer Sister   . Osteoporosis Sister   . Hypertension Brother   . Stroke Mother   . Heart disease Mother   . Hypertension Mother   . Heart disease Maternal Grandmother   . Stroke Maternal Grandfather   . Stroke Paternal Grandfather   . Colon cancer Neg Hx     Allergies  Allergen Reactions  . Codeine Nausea Only    Medication list has been reviewed and updated.  Current Outpatient Medications on File Prior to Visit  Medication Sig Dispense Refill  . alendronate (FOSAMAX) 70 MG tablet TAKE 1 TABLET BY MOUTH EVERY 7 DAYS. TAKE WITH A FULL GLASS OF WATER ON AN EMPTY STOMACH 12 tablet 3  . lisinopril (PRINIVIL,ZESTRIL) 10 MG tablet Take 1 tablet (10 mg total) by mouth daily. 90  tablet 3  . omeprazole (PRILOSEC) 20 MG capsule Take 1 capsule (20 mg total) by mouth daily. 30 capsule 11  . OVER THE COUNTER MEDICATION OTC eye drops for allergies prn     No current facility-administered medications on file prior to visit.     Review of Systems:  As per HPI- otherwise negative.   Physical Examination: Vitals:   07/11/18 1006  BP: 128/82  Pulse: 68  Resp: 16  Temp: 97.7 F (36.5 Hamilton)  SpO2: 99%   Vitals:   07/11/18 1006  Weight: 84 lb (38.1 kg)  Height: 4' 9.5" (1.461 m)   Body mass index is 17.86 kg/m. Ideal Body Weight: Weight in (lb) to have BMI = 25: 117.3  GEN: WDWN, NAD, Non-toxic, A & O x 3, petite build, looks well  HEENT: Atraumatic, Normocephalic. Neck supple. No masses, No LAD. Ears and Nose: No external deformity. CV: RRR, No M/G/R. No JVD. No thrill. No extra heart sounds. PULM: CTA B,  no wheezes, crackles, rhonchi. No retractions. No resp. distress. No accessory muscle use. ABD: S, NT, ND. No rebound. No HSM. EXTR: No Hamilton/Hamilton/e NEURO Normal gait.  PSYCH: Normally interactive. Conversant. Not depressed or anxious appearing.  Calm demeanor.    Assessment and Plan:   ICD-10-CM   1. Screening for hyperlipidemia  Z13.220 Lipid panel  2. Benign essential HTN  I10 lisinopril (ZESTRIL) 10 MG tablet    Comprehensive metabolic panel  3. History of anemia  Z86.2 CBC  4. Underweight  R63.6   5. Age-related osteoporosis without current pathological fracture  M81.0 alendronate (FOSAMAX) 70 MG tablet   Routine follow-up visit today.  Labs and refills ordered.  She just had her bone density scan today, I will watch for this report She prefers to do a mammogram next year Discussed her being underweight.  I encouraged her to try to gain about 5 pounds if she can, by eating more calories.  Explained that being underweight may exacerbate her osteoporosis, and other health problems  Follow-up: No follow-ups on file.  Meds ordered this encounter  Medications  . alendronate (FOSAMAX) 70 MG tablet    Sig: TAKE 1 TABLET BY MOUTH EVERY 7 DAYS. TAKE WITH A FULL GLASS OF WATER ON AN EMPTY STOMACH    Dispense:  12 tablet    Refill:  3  . lisinopril (ZESTRIL) 10 MG tablet    Sig: Take 1 tablet (10 mg total) by mouth daily.    Dispense:  90 tablet    Refill:  3   Orders Placed This Encounter  Procedures  . CBC  . Comprehensive metabolic panel  . Lipid panel    Signed Lamar Blinks, MD  Received her labs and dexa  Results for orders placed or performed in visit on 07/11/18  CBC  Result Value Ref Range   WBC 3.8 (L) 4.0 - 10.5 K/uL   RBC 3.59 (L) 3.87 - 5.11 Mil/uL   Platelets 280.0 150.0 - 400.0 K/uL   Hemoglobin 10.9 (L) 12.0 - 15.0 g/dL   HCT 32.5 (L) 36.0 - 46.0 %   MCV 90.6 78.0 - 100.0 fl   MCHC 33.6 30.0 - 36.0 g/dL   RDW 13.6 11.5 - 15.5 %  Comprehensive metabolic  panel  Result Value Ref Range   Sodium 139 135 - 145 mEq/L   Potassium 4.3 3.5 - 5.1 mEq/L   Chloride 106 96 - 112 mEq/L   CO2 26 19 - 32 mEq/L   Glucose, Bld  98 70 - 99 mg/dL   BUN 9 6 - 23 mg/dL   Creatinine, Ser 6.210.58 0.40 - 1.20 mg/dL   Total Bilirubin 0.5 0.2 - 1.2 mg/dL   Alkaline Phosphatase 57 39 - 117 U/L   AST 14 0 - 37 U/L   ALT 11 0 - 35 U/L   Total Protein 6.0 6.0 - 8.3 g/dL   Albumin 4.0 3.5 - 5.2 g/dL   Calcium 8.8 8.4 - 30.810.5 mg/dL   GFR 657.84100.68 >69.62>60.00 mL/min  Lipid panel  Result Value Ref Range   Cholesterol 196 0 - 200 mg/dL   Triglycerides 95.283.0 0.0 - 149.0 mg/dL   HDL 84.1370.60 >24.40>39.00 mg/dL   VLDL 10.216.6 0.0 - 72.540.0 mg/dL   LDL Cholesterol 366109 (H) 0 - 99 mg/dL   Total CHOL/HDL Ratio 3    NonHDL 125.26    Dg Bone Density  Result Date: 07/11/2018 EXAM: DUAL X-RAY ABSORPTIOMETRY (DXA) FOR BONE MINERAL DENSITY IMPRESSION: Kristin Hamilton Your patient Debby BudJudy Hemrick completed a BMD test on 07/11/2018 using the Lunar IDXA DXA System (analysis version: 16.SP2) manufactured by [employer]. The following summarizes the results of our evaluation. PATIENT: Name: Kristin PiliMorton, Kristin Hamilton Patient ID: [id number] Birth Date: [birth year] Height: 58.2 in. Gender: Female Measured: 07/11/2018 Weight: 83.2 lbs. Indications: Advanced Age, Caucasian, Estrogen Deficiency, Height Loss, History of Fracture (Adult), History of Osteoporosis, Low Body Weight, Low Calcium Intake, Post Menopausal, Previous Tobacco User Fractures: Wrist Treatments: Fosamax(Alendronate) ASSESSMENT: The BMD measured at Femur Total Left is 0.564 g/cm2 with a T-score of -3.5. This patient is considered osteoporotic according to World Health Organization [facility](WHO) criteria. L-2 & 3 was excluded due to degenerative changes. The scan quality is good. Site Region Measured Date Measured Age WHO YA BMD Classification T-score AP Spine L1-L4 (L2,L3) 07/11/2018 77.4 Osteopenia -2.3 0.883 g/cm2 DualFemur Total Left 07[age]11/2018 77.4 [age] Osteoporosis -3.5  0.564 g/cm2 World Health Organization [facility](WHO) criteria for post-menopausal, Caucasian Women: Normal       T-score at or above -1 SD Osteopenia   T-score between -1 and -2.5 SD Osteoporosis T-score at or below -2.5 SD RECOMMENDATION:1. All patients should optimize calcium and vitamin D intake. 2. Consider FDA-approved medical therapies in postmenopausal women and men aged 78 years and older, based on the following: a. A hip or vertebral(clinical or morphometric) fracture. b. T-Score < -2.5 at the femoral neck or spine after appropriate evaluation to exclude secondary causes Hamilton. Low bone mass (T-score between -1.0 and -2.5 at the femoral neck or spine) and a 10 year probability of a hip fracture >3% or a 10 year probability of major osteoporosis-related fracture > 20% based on the [place]-adapted WHO algorithm d. Clinical judgement and/or patient preferences may indicate treatment for people with 10-year fracture probabilities above or below these levels FOLLOW-UP: Patients with diagnosis of osteoporosis or at high risk for fracture should have regular bone mineral density tests. For patients eligible for Medicare, routine testing is allowed once every 2 years. The testing frequency can be increased to one year for patients who have rapidly progressing disease, those who are receiving or discontinuing medical therapy to restore bone mass, or have additional risk factors. I have reviewed this report and agree with the above findings. [facility] Electronically Signed   By: Bary RichardStan  Maynard M.D.   On: 07/11/2018 11:23   No improvement of her bone density with fosamax.  Recommend move to [place]. Letter to pt

## 2018-07-11 ENCOUNTER — Ambulatory Visit (INDEPENDENT_AMBULATORY_CARE_PROVIDER_SITE_OTHER): Payer: Medicare Other | Admitting: Family Medicine

## 2018-07-11 ENCOUNTER — Other Ambulatory Visit: Payer: Self-pay

## 2018-07-11 ENCOUNTER — Encounter: Payer: Self-pay | Admitting: Family Medicine

## 2018-07-11 ENCOUNTER — Ambulatory Visit (HOSPITAL_BASED_OUTPATIENT_CLINIC_OR_DEPARTMENT_OTHER)
Admission: RE | Admit: 2018-07-11 | Discharge: 2018-07-11 | Disposition: A | Discharge status: Home / Self Care | Payer: Medicare Other | Source: Ambulatory Visit | Attending: Family Medicine | Admitting: Family Medicine

## 2018-07-11 ENCOUNTER — Ambulatory Visit: Payer: Medicare Other | Admitting: *Deleted

## 2018-07-11 VITALS — BP 128/82 | HR 68 | Temp 97.7°F | Resp 16 | Ht <= 58 in | Wt 84.0 lb

## 2018-07-11 DIAGNOSIS — R636 Underweight: Secondary | ICD-10-CM

## 2018-07-11 DIAGNOSIS — M810 Age-related osteoporosis without current pathological fracture: Secondary | ICD-10-CM | POA: Diagnosis present

## 2018-07-11 DIAGNOSIS — Z862 Personal history of diseases of the blood and blood-forming organs and certain disorders involving the immune mechanism: Secondary | ICD-10-CM | POA: Diagnosis not present

## 2018-07-11 DIAGNOSIS — I10 Essential (primary) hypertension: Secondary | ICD-10-CM

## 2018-07-11 DIAGNOSIS — Z13220 Encounter for screening for lipoid disorders: Secondary | ICD-10-CM | POA: Diagnosis not present

## 2018-07-11 LAB — COMPREHENSIVE METABOLIC PANEL
ALT: 11 U/L (ref 0–35)
AST: 14 U/L (ref 0–37)
Albumin: 4 g/dL (ref 3.5–5.2)
Alkaline Phosphatase: 57 U/L (ref 39–117)
BUN: 9 mg/dL (ref 6–23)
CO2: 26 mEq/L (ref 19–32)
Calcium: 8.8 mg/dL (ref 8.4–10.5)
Chloride: 106 mEq/L (ref 96–112)
Creatinine, Ser: 0.58 mg/dL (ref 0.40–1.20)
GFR: 100.68 mL/min (ref >60.00)
Glucose, Bld: 98 mg/dL (ref 70–99)
Potassium: 4.3 mEq/L (ref 3.5–5.1)
Sodium: 139 mEq/L (ref 135–145)
Total Bilirubin: 0.5 mg/dL (ref 0.2–1.2)
Total Protein: 6 g/dL (ref 6.0–8.3)

## 2018-07-11 LAB — CBC
HCT: 32.5 % — ABNORMAL LOW (ref 36.0–46.0)
Hemoglobin: 10.9 g/dL — ABNORMAL LOW (ref 12.0–15.0)
MCHC: 33.6 g/dL (ref 30.0–36.0)
MCV: 90.6 fl (ref 78.0–100.0)
Platelets: 280 10*3/uL (ref 150.0–400.0)
RBC: 3.59 Mil/uL — ABNORMAL LOW (ref 3.87–5.11)
RDW: 13.6 % (ref 11.5–15.5)
WBC: 3.8 10*3/uL — ABNORMAL LOW (ref 4.0–10.5)

## 2018-07-11 LAB — LIPID PANEL
Cholesterol: 196 mg/dL (ref 0–200)
HDL: 70.6 mg/dL (ref >39.00)
LDL Cholesterol: 109 mg/dL — ABNORMAL HIGH (ref 0–99)
NonHDL: 125.26
Total CHOL/HDL Ratio: 3
Triglycerides: 83 mg/dL (ref 0.0–149.0)
VLDL: 16.6 mg/dL (ref 0.0–40.0)

## 2018-07-11 MED ORDER — ALENDRONATE SODIUM 70 MG PO TABS: ORAL_TABLET | ORAL | 3 refills | Status: DC

## 2018-07-11 MED ORDER — LISINOPRIL 10 MG PO TABS: 10.0000 mg | ORAL_TABLET | Freq: Every day | ORAL | 3 refills | Status: AC

## 2018-07-11 NOTE — Patient Instructions (Addendum)
It was good to see you today- I will be in touch with your labs and will look for your bone density report I refilled your lisinopril and Fosamax today  You might also want to have the NEW shingles vaccine, called shingrix, given at your pharmacy at your convenience.  This is a 2 shot series  As you mentioned, you have lost about 5 pounds over recent years.  Please do work on trying to gain these 5 pounds back, by eating more calorie dense foods.  Carrying a little bit more weight will help improve your bone density, and give your body energy but it needs.  Assuming all is well, let us plan to visit in 6 months

## 2018-08-26 ENCOUNTER — Other Ambulatory Visit: Payer: Self-pay

## 2019-05-09 MED ORDER — ACETAMINOPHEN 500 MG PO TABS
1000.00 | ORAL_TABLET | ORAL | Status: DC
Start: 2019-05-09 — End: 2019-05-09

## 2019-05-09 MED ORDER — AMLODIPINE BESYLATE 5 MG PO TABS
10.00 | ORAL_TABLET | ORAL | Status: DC
Start: 2019-05-10 — End: 2019-05-09

## 2019-05-09 MED ORDER — GENERIC EXTERNAL MEDICATION
Status: DC
Start: 2019-05-09 — End: 2019-05-09

## 2019-05-09 MED ORDER — ENOXAPARIN SODIUM 30 MG/0.3ML ~~LOC~~ SOLN
30.00 | SUBCUTANEOUS | Status: DC
Start: 2019-05-10 — End: 2019-05-09

## 2019-05-09 MED ORDER — ONDANSETRON HCL 4 MG/2ML IJ SOLN
4.00 | INTRAMUSCULAR | Status: DC
Start: ? — End: 2019-05-09

## 2019-05-09 MED ORDER — LABETALOL HCL 5 MG/ML IV SOLN
10.00 | INTRAVENOUS | Status: DC
Start: ? — End: 2019-05-09

## 2019-05-09 MED ORDER — LISINOPRIL 20 MG PO TABS
40.00 | ORAL_TABLET | ORAL | Status: DC
Start: 2019-05-10 — End: 2019-05-09

## 2019-05-09 MED ORDER — DEXTROSE 10 % IV SOLN
125.00 | INTRAVENOUS | Status: DC
Start: ? — End: 2019-05-09

## 2019-05-09 MED ORDER — PRO-STAT PO LIQD
ORAL | Status: DC
Start: 2019-05-10 — End: 2019-05-09

## 2019-05-09 MED ORDER — SENNOSIDES-DOCUSATE SODIUM 8.6-50 MG PO TABS
1.00 | ORAL_TABLET | ORAL | Status: DC
Start: ? — End: 2019-05-09

## 2019-05-09 MED ORDER — GENERIC EXTERNAL MEDICATION
40.00 | Status: DC
Start: 2019-05-10 — End: 2019-05-09

## 2019-05-09 MED ORDER — GLUCOSE 40 % PO GEL
15.00 | ORAL | Status: DC
Start: ? — End: 2019-05-09

## 2019-05-09 MED ORDER — HYDROCHLOROTHIAZIDE 25 MG PO TABS
25.00 | ORAL_TABLET | ORAL | Status: DC
Start: 2019-05-10 — End: 2019-05-09

## 2019-05-09 MED ORDER — POLYETHYLENE GLYCOL 3350 17 GM/SCOOP PO POWD
17.00 | ORAL | Status: DC
Start: ? — End: 2019-05-09

## 2019-05-09 MED ORDER — ENALAPRILAT 1.25 MG/ML IV INJ
0.63 | INJECTION | INTRAVENOUS | Status: DC
Start: ? — End: 2019-05-09

## 2019-06-14 ENCOUNTER — Encounter: Payer: Self-pay | Admitting: Family Medicine

## 2019-06-14 DIAGNOSIS — I619 Nontraumatic intracerebral hemorrhage, unspecified: Secondary | ICD-10-CM | POA: Insufficient documentation

## 2019-06-28 ENCOUNTER — Other Ambulatory Visit: Payer: Self-pay | Admitting: Family Medicine

## 2019-06-28 DIAGNOSIS — M810 Age-related osteoporosis without current pathological fracture: Secondary | ICD-10-CM

## 2019-07-11 ENCOUNTER — Ambulatory Visit: Payer: Medicare Other | Admitting: *Deleted

## 2020-05-24 ENCOUNTER — Encounter: Payer: Self-pay | Admitting: Internal Medicine

## 2023-04-27 DEATH — deceased

## 2023-08-25 ENCOUNTER — Other Ambulatory Visit: Payer: Self-pay | Admitting: Obstetrics and Gynecology

## 2023-08-25 DIAGNOSIS — Z1231 Encounter for screening mammogram for malignant neoplasm of breast: Secondary | ICD-10-CM

## 2023-09-09 ENCOUNTER — Ambulatory Visit
